# Patient Record
Sex: Male | Born: 1990 | Race: White | Hispanic: No
Health system: Southern US, Community
[De-identification: ages and names within clinical notes are randomized; demographics above are authoritative.]

## PROBLEM LIST (undated history)

## (undated) DIAGNOSIS — F329 Major depressive disorder, single episode, unspecified: Secondary | ICD-10-CM

## (undated) DIAGNOSIS — F419 Anxiety disorder, unspecified: Secondary | ICD-10-CM

## (undated) DIAGNOSIS — F32A Depression, unspecified: Secondary | ICD-10-CM

## (undated) HISTORY — DX: Depression, unspecified: F32.A

## (undated) HISTORY — DX: Major depressive disorder, single episode, unspecified: F32.9

## (undated) HISTORY — DX: Anxiety disorder, unspecified: F41.9

## (undated) HISTORY — PX: OTHER SURGICAL HISTORY: SHX169

---
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
## 2012-01-02 ENCOUNTER — Other Ambulatory Visit: Payer: Self-pay | Admitting: Family Medicine

## 2012-01-02 DIAGNOSIS — Z0000 Encounter for general adult medical examination without abnormal findings: Secondary | ICD-10-CM

## 2012-01-11 ENCOUNTER — Ambulatory Visit
Admission: RE | Admit: 2012-01-11 | Discharge: 2012-01-11 | Disposition: A | Discharge status: Home / Self Care | Payer: 59 | Source: Ambulatory Visit | Attending: Family Medicine | Admitting: Family Medicine

## 2012-01-11 DIAGNOSIS — Z0000 Encounter for general adult medical examination without abnormal findings: Secondary | ICD-10-CM

## 2012-08-20 ENCOUNTER — Telehealth (HOSPITAL_COMMUNITY): Payer: Self-pay | Admitting: Marriage and Family Therapist

## 2012-08-20 ENCOUNTER — Ambulatory Visit (INDEPENDENT_AMBULATORY_CARE_PROVIDER_SITE_OTHER): Payer: 59 | Admitting: Marriage and Family Therapist

## 2012-08-20 DIAGNOSIS — F321 Major depressive disorder, single episode, moderate: Secondary | ICD-10-CM

## 2012-08-20 DIAGNOSIS — F411 Generalized anxiety disorder: Secondary | ICD-10-CM

## 2012-08-20 DIAGNOSIS — F509 Eating disorder, unspecified: Secondary | ICD-10-CM

## 2012-08-20 DIAGNOSIS — F331 Major depressive disorder, recurrent, moderate: Secondary | ICD-10-CM

## 2012-08-20 NOTE — Progress Notes (Signed)
PSYCHOSOCIAL ASSESSMENT  Patient ID: [name], male   DOB: 01-16-1991, 22 y.o.   MRN: [id number] Patient:   [name]   DOB:   [birth year]  MR Number:  [id number]  Location:  [facility] OUTPAT[facility]ERAP[facility] [place] [id number] [place] [address] Dept: [phone]           Date of Service:   08/19/12     Start Time:   9:00 a.m. End Time:   10:00 a.m.  Provider/Observer:  Cleophas Dunker LMFT       Billing Code/Service: [phone]  Chief Complaint:    No chief complaint on file.   Reason for Service:   Patient reports depression, anxiety, and intense feelings of guilt around parents and others.  He reports having these feelings for at least four years.  He reports having nightmares, problems with food and body image, loss of interest in activities, suicidal thoughts as well.  Patient reports having trouble in school with focusing, that he gets lost in his thoughts.  He has also had difficulty connecting with others at his previous school due to uncommon interests and his girlfriend cheated on him, all causing patient to do poorly academically and feeling isolated from others while at school.    Current Status:   Patient completed the Burns Anxiety Inventory with a score of 55, extreme anxiety, and the Burns Depression Checklist with a score of 29, moderate depression.  He did score high in suicidal impulses on the Burns.  Currently patient has changed some of his situation by attending a school closer to home where he has support in friends.  He is currently on academic suspension due to grades but will be returning in the fall.  He reports having a good support system.  Currently he may have to move home due to finances and has a lot of anxiety around financial problems and problems with his parents.    Reliability of Information:  Patient is a good historian and appeared open and truthful during the  assessment.  Behavioral Observation: [name]  presents as a 22 y.o.-year-old Caucasian Male who appeared his stated age. his dress was Appropriate and he was Casual and his manners were Appropriate to the situation.  There were not any physical disabilities noted.  he displayed an appropriate level of cooperation and motivation.    Interactions:    Active   Attention:   normal  Memory:   within normal limits  Visuo-spatial:   not examined  Speech (Volume):  normal  Speech:   normal pitch and normal volume  Thought Process:  Relevant  Though Content:  WNL  Orientation:   person, place and time/date  Judgment:   Fair  Planning:   Fair  Affect:    Depressed  Mood:    Depressed  Insight:   Fair  Intelligence:   normal  Marital Status/Living: Patient is single with no children.  Patient states he is in a "constant battle with his parents."  He is an only child.  Patient is on his own at this time but reports he may have to move back in with his parents.  He reports the relationship is strained, and describes his parents as "helicopter parents."  He is worried because if he moves home he will have no privacy since his parents' bedroom is next to his.  He reports his relationship with his parents as being significant regarding why he is depressed  and anxious.  Patient states he has frequent nightmares about "mom's expectations."  He reports feeling guilty when he gets angry or thinks negatively about them.  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  In the future patient wants to work in skateboarding media possibly having to do with skateboarding magazines.  Past Employment:  Patient reports no past employment.  Substance Use:  No substance abuse noted.  Patient states that because he does not use any drugs and/or alcohol he has felt  isolated at college since most other student do use.  His feelings of isolation caused him some depression and poor academic performance which lead to his leaving [employer].  Patient does admit to some use in the past with drinking alcohol four years ago and marijuana using rarely with last use in 2011.  Education:   HS Graduate.  Patient has some college.  He was a film studies major at [employer] but left due to feelings of isolation (see below) and the breakup with his girlfriend from high school who he said cheated on him.  He said upon leaving his grades were poor due to a depression.  He reports coming back home and going to [place] and majored in media studies in the Fall of 2013 where he had a stronger support system that he said is important to him.  He reports although he had all A's and one B, the B put him on academic suspension due to [employer]'s grades.  He is not allowed to go back to school until the Fall.    Medical History:  No past medical history on file.      No outpatient encounter prescriptions on file as of 08/20/2012.   No facility-administered encounter medications on file as of 08/20/2012.        Patient reports he is currently on no medications for depression or anxiety.  Patient only reports arm fractures that happened as a result of skateboarding.  Sexual History:   History  Sexual Activity  . Sexually Active: Not on file    Abuse/Trauma History:  Denies experiencing any form of abuse.  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 He was treated in an intensive outpatient program in [place] where he stayed in a [employer] house.  His parents participated in the treatment.  Thereafter, he saw a therapist for individual weekly therapy for about a year.  He does not remember the therapist's name.  He also states that his parents and patient had a family session with another therapist that was so upsetting for him that he stopped seeing that  therapist.  Family Med/Psych History: Patient reports his paternal grandfather had severe depression.  Risk of Suicide/Violence: moderate re suicide ideation.  Patient reports having multiple suicidal thoughts over the past years, spending time contemplating suicide. He reports no history of attempts. He reported no real plan although he says he "longs for the convenience of death" at times.  Patient reports his interactions with his parents are his biggest trigger.  Patient states when he is depressed he thinks, "I can just kill myself."  He reports when this happens he "gets a warm feeling about killing himself."  However, patient states this feeling also leads to him feeling scared.  He says he fears he will mess up the suicide  attempt.  His last thought was four days ago over an argument he had with his parents.  He says he felt humiliated because the argument was in church in front of others over a missing TV remote.  Patient denies any homicidal ideation.  Impression/DX: Patient is a 22 year-old male with depression and anxiety.   He has trouble focusing in school.  At this time it is difficult to say if patient has ADHD due to the level of depression and anxiety in particular.  Will not rule it out as a possibilty.  He also shows poor self-esteem particularly in areas of decision-making and body image.  Patient has difficulty with body image and admits to eating emotionally.  Although he has been treated for this condition it has improved but still remains a problem for him.  Patient has a difficult relationship with his parents, particularly his mother, although he loves them and does not blame them for why he has difficulty in these relationships. Another stressor is his financial situation and does not want to move back in with his parents.  He admits this is his biggest trigger for depression.  He does not want parents involved with his therapy at this time. Patient's strengths:  Patient wants to  get better.  He is fairly insightful for a 22 year-old.  He also has a good support system through friends.  He has passions, specifically playing the guitar (for five years), plays in a band, skateboarding (for 11 years), and exercises.  Goal of therapy:  "I'd like to figure out how to better control where my problems are stemming from and feel a bit more confident with decisions I'm making with my life."     Disposition/Plan: Patient will attend individual therapy weekly.  He will be assessed for suicide on an ongoing basis.  This will include using CBT around identifying faulty thinking related to suicide.  Will teach and encourage patient ways to individuate in healthy ways from his parents as a young adult through assessment training.  This will also help him learn to trust his decision-making and increase his self-esteem.  Will use strength-based therapy to help patient identify his strengths and successes.  He will learn CBT to decrease his depression and anxiety.  Will learn biofeedback, breathing, and medication practices to help patient focus and to calm his anxiety.  Explore the option of medication as part of his treatment.  He was given Suicide Information Booklet and told what numbers he can call if needed.  Diagnosis:    Axis I:   Major depressive d/o, moderate       GAD       Eating d/o, NOS      Axis II: Deferred       Axis III:   None - past fractures due to skateboarding      Axis IV:  economic problems, educational problems, housing problems and problems with primary support group          Axis V:  51-60 moderate symptoms  Cleophas Dunker, LMFT, CTS COUNSELOR August 19, 2012

## 2012-08-28 ENCOUNTER — Ambulatory Visit (INDEPENDENT_AMBULATORY_CARE_PROVIDER_SITE_OTHER): Payer: 59 | Admitting: Marriage and Family Therapist

## 2012-08-28 DIAGNOSIS — F331 Major depressive disorder, recurrent, moderate: Secondary | ICD-10-CM

## 2012-08-28 DIAGNOSIS — F509 Eating disorder, unspecified: Secondary | ICD-10-CM

## 2012-08-28 DIAGNOSIS — F410 Panic disorder [episodic paroxysmal anxiety] without agoraphobia: Secondary | ICD-10-CM

## 2012-08-28 NOTE — Progress Notes (Signed)
   THERAPIST PROGRESS NOTE  Session Time:  2:00 - 3:00 p.m.  Participation Level: Active  Behavioral Response: CasualAlertAnxious and Depressed  Type of Therapy: Individual Therapy  Treatment Goals addressed: Coping  Interventions: CBT, Strength-based, Biofeedback, Assertiveness Training, Supportive and Meditation: Mindfulness  Summary: [name] is a 22 y.o. male who presents with depression, anxiety, and an eating disorder.  He was referred by Annabell Sabal, Manager.   Patient reports today he is "not doing too bad."  He talked about his history with an eating disorder that started in high school.  He reports other students made fun of him because he was bigger than other students.  Patient's mother encouraged him to join the track team which he did leading to becoming obsessive with the running.  From that point he started to eat less, at times chewing food and then spitting it out as a habit.  He reported losing 45 lbs.  Patient stated he got into a treatment program after his pediatric physician, who told him she had had an eating disorder, noticed signs of anorexia and put him in the [facility] for evaluation.  From that point he completed an eating program in [place] whose focus was helping patient gain weight.  After completion for a year patient worked with a dietitian and was in therapy, in part using CBT around faulty perceptions related to eating.  He reports still having problems at time wanting to only chew his food, and notices this is around feeling stress.  He did say he no longer has a problem with weight and never weighs himself, knowing that there will be consequences in his recovery if he starts that behavior.  Patient states that part of his therapy is to identify his triggers around eating.  Suicidal/Homicidal: Nowithout intent/plan  Therapist Response:   Reviewed patient's treatment plan.  Patient agreed and signed it and received a copy of same.  Discussed in  depth patient's suicidal thinking related to CBT (specifically around patient finding comfort in suicidal thinking) and that he will be periodically assessed.  Reiterated patient's Suicide Information brochure as a resource and patient agreed to use it if necessary.  Discussed at length patient's score on his Burns Anxiety Checklist and gave patient information related to identifying triggers around food using CBT.  Specifically talked about starting to identify other concerns before identifying triggers such as identifying what sensations patient was feeling in his body (the ones he listed on the Burns).  This is patient's homework for next week.  Also discussed patient's substance abuse history.  He admits to only trying alcohol and marijuana in high school once, decided "this is not for me," and became a "strait edge" (takes no drugs).  Also discussed patient considering psychotropic medications due to level of depression and anxiety.  Patient reports taking Zoloft for a year after treatment and "hated how he felt on it."  He does not want to be on medication at this time and took it off of his treatment plan.  Plan: Return again in 1 weeks.  Diagnosis: Axis I: Major depressive d/o, moderate; Panic d/o; Eating d/o, NOS    Axis II: Deferred    Ratasha Fabre, LMFT, CTS 08/28/2012

## 2012-09-04 ENCOUNTER — Ambulatory Visit (INDEPENDENT_AMBULATORY_CARE_PROVIDER_SITE_OTHER): Payer: 59 | Admitting: Marriage and Family Therapist

## 2012-09-04 DIAGNOSIS — F331 Major depressive disorder, recurrent, moderate: Secondary | ICD-10-CM

## 2012-09-04 DIAGNOSIS — F410 Panic disorder [episodic paroxysmal anxiety] without agoraphobia: Secondary | ICD-10-CM

## 2012-09-04 NOTE — Progress Notes (Signed)
   THERAPIST PROGRESS NOTE  Session Time: 1:00 - 2:00 p.m.  Participation Level: Active  Behavioral Response: CasualAlertAnxious and Depressed  Type of Therapy: Individual Therapy  Treatment Goals addressed: Coping  Interventions: CBT, Strength-based, Biofeedback and Supportive  Summary: [name] is a 22 y.o. male who presents with depression, anxiety, and an eating disorder.  He was referred by Annabell Sabal, Manager.   Patient first reported he went to see a band in [place] that triggered his feeling depressed because he had left [place].  Patient stated he knew why - because he was "second guessing" his decision to leave [employer].  He also said it brought back negative memories being there.  Patient states he did not really do the homework assignment to identify earlier what sensations he was experiencing when triggered by these symptoms/triggers.    Suicidal/Homicidal: Nowithout intent/plan  Therapist Response: Gave patient a copy of his Burns Anxiety Inventory to help him identify feelings and sensations around his anxiety; the purpose is to help him identify same before being triggered behaviorally (with anxiety and depression).  Also began CBT.  Patient did very well because he had experienced CBT while in treatment for his eating disorder.  He states as he was going through the list of cognitive distortions that he had been working on some of them since having eating disorder treatment without being aware he was doing the work.  Patient states he has done a lot of work on "all or nothing thinking" in particular.  He also reports still having difficult with the following:  Overgeneralizing, mental filter, moreso with disqualifying the positive and jumping to conclusions. He admits that when he is in a situation where he feels uncomfortable (such as in a crowd with the opposite sex), he believes "no one would be interested in talking to me."  However, he also states he is used to  being an only child and observing situations before reacting, leading to jumping to conclusions being "buffered" by this skill.  He also uses catastrophizing, emotional reasoning (to some degree), "should" statements, and personalization.  He talked about having difficulty making decisions because he does not trust himself enough to think he is making the "right" decision.  Discussed all decisions having a "trade off."  Homework is to  Begin to identify distortions, rate how he feels, and challenge this thinking.  He will discuss in the next session.  Also educated patient about what anxiety is and what a panic attack is.  Also began educating patient about [employer] biofeedback.  Gave patient HM brochure and taught the first breathing technique:  Neutral breathing (focus is on heart to help patient defocus away from his ruminating).  He will do the exercise 30 seconds to five minutes whenever he thinks about the technique.  Plan: Return again in 1 weeks.  Diagnosis: Axis I: Major depressive d/o, moderate; Panic d/o; Eating d/o NOS    Axis II: Deferred    Katielynn Horan, LMFT, CTS 09/04/2012

## 2012-09-10 ENCOUNTER — Ambulatory Visit (HOSPITAL_COMMUNITY): Payer: Self-pay | Admitting: Marriage and Family Therapist

## 2012-09-18 ENCOUNTER — Ambulatory Visit (HOSPITAL_COMMUNITY): Payer: Self-pay | Admitting: Marriage and Family Therapist

## 2012-09-18 ENCOUNTER — Telehealth (HOSPITAL_COMMUNITY): Payer: Self-pay | Admitting: Marriage and Family Therapist

## 2012-09-18 NOTE — Progress Notes (Unsigned)
   THERAPIST PROGRESS NOTE  Session Time:  10:00 - 11:00 a.m.  Participation Level: {BHH PARTICIPATION LEVEL:22264}  Behavioral Response: {Appearance:22683}{BHH LEVEL OF CONSCIOUSNESS:22305}{BHH MOOD:22306}  Type of Therapy: Individual Therapy  Treatment Goals addressed: Coping  Interventions: {CHL AMB BH Type of Intervention:21022753}  Summary: [name] is a 22 y.o. male who presents with depression, anxiety, and an eating disorder.  He was referred by Annabell Sabal.   Suicidal/Homicidal: {BHH YES OR NO:22294}{yes/no/with/without intent/plan:22693}  Therapist Response: ***  Plan: Return again in 1 weeks.  Diagnosis: Axis I: Major depressive d/o, moderate; Panic d/o; Eating d/o NOS    Axis II: Deferred    Alencia Gordon, LMFT, CTS 09/18/2012

## 2012-09-24 ENCOUNTER — Ambulatory Visit (HOSPITAL_COMMUNITY): Payer: Self-pay | Admitting: Marriage and Family Therapist

## 2012-09-24 NOTE — Progress Notes (Unsigned)
   THERAPIST PROGRESS NOTE  Session Time:  9:00 - 10:00 a.m.  Participation Level: Did Not Attend  Behavioral Response:   Type of Therapy: Individual Therapy  Treatment Goals addressed: Coping  Interventions:   Summary: [name] is a 22 y.o. male who presents with depression, anxiety, and an eating disorder.  He was referred by Annabell Sabal, Director.  PATIENT DID NOT SHOW FOR HIS APOINTMENT.  Suicidal/Homicidal:   Therapist Response:   THIS IS THE SECOND WEEK IN A ROW PATIENT HAS NOT SHOWN FOR HIS APPOINTMENT.  WILL CHARGE FOR SESSION.  Plan: Return again in 1 weeks.  Diagnosis: Axis I: Major depressive d/o, moderate; Panic d/o; Eating d/o, NOS    Axis II: Deferred    Jahmarion Popoff, LMFT, CTS 09/24/2012

## 2012-10-01 ENCOUNTER — Telehealth (HOSPITAL_COMMUNITY): Payer: Self-pay | Admitting: Marriage and Family Therapist

## 2012-10-01 ENCOUNTER — Ambulatory Visit (HOSPITAL_COMMUNITY): Payer: Self-pay | Admitting: Marriage and Family Therapist

## 2012-10-01 NOTE — Telephone Encounter (Signed)
Patient came in worried that he would be discharged due to missing appointments.  Spoke with him to let him know he would not be discharged at this time.  Cleophas Dunker, LMFT, CTS October 01, 2012

## 2012-10-08 ENCOUNTER — Ambulatory Visit (INDEPENDENT_AMBULATORY_CARE_PROVIDER_SITE_OTHER): Payer: 59 | Admitting: Marriage and Family Therapist

## 2012-10-08 DIAGNOSIS — F410 Panic disorder [episodic paroxysmal anxiety] without agoraphobia: Secondary | ICD-10-CM

## 2012-10-08 DIAGNOSIS — F509 Eating disorder, unspecified: Secondary | ICD-10-CM

## 2012-10-08 DIAGNOSIS — F331 Major depressive disorder, recurrent, moderate: Secondary | ICD-10-CM

## 2012-10-08 NOTE — Progress Notes (Signed)
   THERAPIST PROGRESS NOTE  Session Time:  10:00 - 11:00 a.m.  Participation Level: Active  Behavioral Response: CasualAlertAnxious  Type of Therapy: Individual Therapy  Treatment Goals addressed: Coping  Interventions: Strength-based, Biofeedback and Supportive  Summary: [name] is a 22 y.o. male who presents with depression, anxiety, and an eating disorder.  He was referred by Annabell Sabal.  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  He reports he not only forgot these appointments but ended up having trouble with work because he scheduled himself at two jobs at the same time.  He reports he has been feeling "okay," but did say he had a breakdown over being overscheduled, something he periodically experiences.  He admits having a breakdown in front of others scares other people, and described patient getting so upset he ends up repeatedly hitting himself (patient does this to not hit anyone else) which works as a release of feelings.  He says he is worried because he has not found a way to stop this and if he does he does not have another release that would work as well.     Suicidal/Homicidal: Nowithout intent/plan  Therapist Response:   Patient reports he has been using the [employer] biofeedback breathing techniques and said they seemed to be helping with his symptoms.  Patient was introduced to the [employer].  He was educated about what biofeedback is and how it can help with his symptoms.  Discussed finding a more positive baseline to the one he has "depression and anxiety."  Patient was 67% in the red (not calm) during the baseline for 30 seconds.  He was 100% in the red when asked to experience a negative event (arguments with mother).  He was however able to maintain coherence for 91% of the time (being in the green on the screen).  He was also asked to incorporate a  positive feeling into his breathing and kept coherent (calm) for 74% of the time.  His homework is to use the neutral technique first and then do 3-5 minutes of quick coherence (incorporating a positive feeling), in the morning and before bed.  He will also do it throughout the day.  Plan: Return again in 1 weeks.  Diagnosis: Axis I: Major depressive d/o, moderate; Panic d/o, Eating d/o NOS    Axis II: Deferred    Soren Lazarz, LMFT, CTS 10/08/2012

## 2012-10-15 ENCOUNTER — Ambulatory Visit (HOSPITAL_COMMUNITY): Payer: Self-pay | Admitting: Marriage and Family Therapist

## 2012-10-16 ENCOUNTER — Encounter (HOSPITAL_COMMUNITY): Payer: Self-pay

## 2012-10-22 ENCOUNTER — Ambulatory Visit (HOSPITAL_COMMUNITY): Payer: Self-pay | Admitting: Marriage and Family Therapist

## 2012-10-29 ENCOUNTER — Ambulatory Visit (HOSPITAL_COMMUNITY): Payer: Self-pay | Admitting: Marriage and Family Therapist

## 2012-11-05 ENCOUNTER — Ambulatory Visit (HOSPITAL_COMMUNITY): Payer: Self-pay | Admitting: Marriage and Family Therapist

## 2012-11-10 ENCOUNTER — Ambulatory Visit (HOSPITAL_COMMUNITY): Payer: Self-pay | Admitting: Psychiatry

## 2012-11-12 ENCOUNTER — Ambulatory Visit (HOSPITAL_COMMUNITY): Payer: Self-pay | Admitting: Marriage and Family Therapist

## 2012-11-19 ENCOUNTER — Ambulatory Visit (HOSPITAL_COMMUNITY): Payer: Self-pay | Admitting: Marriage and Family Therapist

## 2012-11-26 ENCOUNTER — Ambulatory Visit (HOSPITAL_COMMUNITY): Payer: Self-pay | Admitting: Marriage and Family Therapist

## 2012-12-10 ENCOUNTER — Ambulatory Visit (HOSPITAL_COMMUNITY): Payer: Self-pay | Admitting: Marriage and Family Therapist

## 2013-06-11 ENCOUNTER — Emergency Department (HOSPITAL_COMMUNITY)
Admission: EM | Admit: 2013-06-11 | Discharge: 2013-06-12 | Disposition: A | Discharge status: Home / Self Care | Payer: 59 | Attending: Emergency Medicine | Admitting: Emergency Medicine

## 2013-06-11 ENCOUNTER — Emergency Department (HOSPITAL_COMMUNITY): Payer: 59

## 2013-06-11 ENCOUNTER — Encounter (HOSPITAL_COMMUNITY): Payer: Self-pay | Admitting: Emergency Medicine

## 2013-06-11 DIAGNOSIS — IMO0002 Reserved for concepts with insufficient information to code with codable children: Secondary | ICD-10-CM | POA: Insufficient documentation

## 2013-06-11 DIAGNOSIS — Y929 Unspecified place or not applicable: Secondary | ICD-10-CM | POA: Insufficient documentation

## 2013-06-11 DIAGNOSIS — S62309A Unspecified fracture of unspecified metacarpal bone, initial encounter for closed fracture: Secondary | ICD-10-CM

## 2013-06-11 DIAGNOSIS — W2209XA Striking against other stationary object, initial encounter: Secondary | ICD-10-CM | POA: Insufficient documentation

## 2013-06-11 DIAGNOSIS — Z79899 Other long term (current) drug therapy: Secondary | ICD-10-CM | POA: Insufficient documentation

## 2013-06-11 DIAGNOSIS — Y939 Activity, unspecified: Secondary | ICD-10-CM | POA: Insufficient documentation

## 2013-06-11 DIAGNOSIS — S62319A Displaced fracture of base of unspecified metacarpal bone, initial encounter for closed fracture: Secondary | ICD-10-CM | POA: Insufficient documentation

## 2013-06-11 NOTE — ED Notes (Signed)
Pt states he punched a brick wall with his right hand and is now having pain  Pt has swelling and abrasion noted

## 2013-06-12 MED ORDER — IBUPROFEN 800 MG PO TABS
800.0000 mg | ORAL_TABLET | Freq: Once | ORAL | Status: AC
  Administered 2013-06-12: 800 mg via ORAL
  Filled 2013-06-12: qty 1

## 2013-06-12 MED ORDER — IBUPROFEN 800 MG PO TABS: 800.0000 mg | ORAL_TABLET | Freq: Three times a day (TID) | ORAL | Status: DC

## 2013-06-12 NOTE — Discharge Instructions (Signed)
Take ibuprofen with food, up to 800mg  three times a day, as needed for pain.   Ice 2-3 times a day for 15-20 minutes.  Elevate as often as possible.  Follow up with the hand surgeon you have been referred to.   You may return to the ER if symptoms worsen or you have any other concerns.

## 2013-06-12 NOTE — ED Notes (Signed)
Ortho paged to place splint at this time

## 2013-06-12 NOTE — ED Provider Notes (Signed)
Medical screening examination/treatment/procedure(s) were performed by non-physician practitioner and as supervising physician I was immediately available for consultation/collaboration.   EKG Interpretation None       Derwood KaplanAnkit Ivor Kishi, MD 06/12/13 2221

## 2013-06-12 NOTE — ED Provider Notes (Signed)
CSN: [id number]     Arrival date & time 06/11/13  2339 History   First MD Initiated Contact with Patient 06/12/13 [phone]     Chief Complaint  Patient presents with  . Hand Injury     (Consider location/radiation/quality/duration/timing/severity/associated sxs/prior Treatment) HPI History provided by pt.   Pt punched a brick wall with his right hand this evening.  Has had severe pain medial aspect ever since.  Aggravated by ROM pinky.  Associated w/ edema.  Denies paresthesias.  Has not taken anything for pain. History reviewed. No pertinent past medical history. Past Surgical History  Procedure Laterality Date  . Arm surgery bialterally      Family History  Problem Relation Age of Onset  . Cancer Other    History  Substance Use Topics  . Smoking status: Never Smoker   . Smokeless tobacco: Not on file  . Alcohol Use: No    Review of Systems  All other systems reviewed and are negative.      Allergies  Review of patient's allergies indicates no known allergies.  Home Medications   Current Outpatient Rx  Name  Route  Sig  Dispense  Refill  . fluticasone (FLONASE) 50 MCG/ACT nasal spray   Each Nare   Place 1 spray into both nostrils daily as needed for allergies or rhinitis.         . Multiple Vitamin (MULTIVITAMIN WITH MINERALS) TABS tablet   Oral   Take 1 tablet by mouth daily.         Marland Kitchen ibuprofen (ADVIL,MOTRIN) 800 MG tablet   Oral   Take 1 tablet (800 mg total) by mouth 3 (three) times daily.   12 tablet   0    BP 116/72  Pulse 90  Temp(Src) 98.5 F (36.9 C) (Oral)  Resp 18  Ht 5\' 7"  (1.702 m)  Wt 146 lb (66.225 kg)  BMI 22.86 kg/m2  SpO2 97% Physical Exam  Nursing note and vitals reviewed. Constitutional: He is oriented to person, place, and time. He appears well-developed and well-nourished. No distress.  HENT:  Head: Normocephalic and atraumatic.  Eyes:  Normal appearance  Neck: Normal range of motion.  Pulmonary/Chest: Effort normal.   Musculoskeletal: Normal range of motion.  Edema and tenderness dorsal aspect 4th and 5th metacarpals.  Pain w/ passive flexion/extension 5th finger.  No pain w/ ROM of wrist.  Brisk cap refill and distal sensation intact.  Neurological: He is alert and oriented to person, place, and time.  Psychiatric: He has a normal mood and affect. His behavior is normal.    ED Course  Procedures (including critical care time) Labs Review Labs Reviewed - No data to display Imaging Review Dg Hand Complete Right  06/12/2013   CLINICAL DATA:  And injury. Bruising and swelling fifth metacarpal area.  EXAM: RIGHT HAND - COMPLETE 3+ VIEW  COMPARISON:  None.  FINDINGS: There is a nondisplaced fracture through the base of the fifth metacarpal. There is overlying soft tissue swelling. There is no dislocation. Joint spaces are preserved.  IMPRESSION: Fracture through the base of the fifth metacarpal.   Electronically Signed   By: Sebastian Ache   On: 06/12/2013 01:06     EKG Interpretation None      MDM   Final diagnoses:  Fracture of metacarpal of right hand, closed    23yo healthy M presents w/ boxer's fracture. NV intact.  Ortho tech placed in ulna gutter splint.  Pt received ibuprofen for pain.  Referred  to Hand.  Return precautions discussed. 3:19 AM     Otilio Miuatherine E Henry Demeritt, PA-C 06/12/13 [phone]

## 2013-06-15 ENCOUNTER — Emergency Department (HOSPITAL_COMMUNITY)
Admission: EM | Admit: 2013-06-15 | Discharge: 2013-06-15 | Disposition: A | Discharge status: Home / Self Care | Payer: 59 | Attending: Emergency Medicine | Admitting: Emergency Medicine

## 2013-06-15 ENCOUNTER — Encounter (HOSPITAL_COMMUNITY): Payer: Self-pay | Admitting: Emergency Medicine

## 2013-06-15 DIAGNOSIS — F329 Major depressive disorder, single episode, unspecified: Secondary | ICD-10-CM | POA: Diagnosis present

## 2013-06-15 DIAGNOSIS — F3289 Other specified depressive episodes: Secondary | ICD-10-CM | POA: Insufficient documentation

## 2013-06-15 DIAGNOSIS — Z8781 Personal history of (healed) traumatic fracture: Secondary | ICD-10-CM | POA: Insufficient documentation

## 2013-06-15 DIAGNOSIS — F32A Depression, unspecified: Secondary | ICD-10-CM | POA: Diagnosis present

## 2013-06-15 DIAGNOSIS — R45851 Suicidal ideations: Secondary | ICD-10-CM | POA: Insufficient documentation

## 2013-06-15 DIAGNOSIS — F39 Unspecified mood [affective] disorder: Secondary | ICD-10-CM | POA: Insufficient documentation

## 2013-06-15 DIAGNOSIS — F411 Generalized anxiety disorder: Secondary | ICD-10-CM | POA: Insufficient documentation

## 2013-06-15 DIAGNOSIS — F419 Anxiety disorder, unspecified: Secondary | ICD-10-CM | POA: Diagnosis present

## 2013-06-15 LAB — COMPREHENSIVE METABOLIC PANEL
ALK PHOS: 69 U/L (ref 39–117)
ALT: 16 U/L (ref 0–53)
AST: 19 U/L (ref 0–37)
Albumin: 3.8 g/dL (ref 3.5–5.2)
BUN: 13 mg/dL (ref 6–23)
CO2: 29 mEq/L (ref 19–32)
Calcium: 9.8 mg/dL (ref 8.4–10.5)
Chloride: 103 mEq/L (ref 96–112)
Creatinine, Ser: 0.77 mg/dL (ref 0.50–1.35)
GLUCOSE: 94 mg/dL (ref 70–99)
POTASSIUM: 4.8 meq/L (ref 3.7–5.3)
Sodium: 139 mEq/L (ref 137–147)
Total Bilirubin: 1.2 mg/dL (ref 0.3–1.2)
Total Protein: 6.4 g/dL (ref 6.0–8.3)

## 2013-06-15 LAB — CBC WITH DIFFERENTIAL/PLATELET
Basophils Absolute: 0 10*3/uL (ref 0.0–0.1)
Basophils Relative: 1 % (ref 0–1)
Eosinophils Absolute: 0 10*3/uL (ref 0.0–0.7)
Eosinophils Relative: 1 % (ref 0–5)
HEMATOCRIT: 43 % (ref 39.0–52.0)
HEMOGLOBIN: 15.7 g/dL (ref 13.0–17.0)
LYMPHS ABS: 0.9 10*3/uL (ref 0.7–4.0)
Lymphocytes Relative: 20 % (ref 12–46)
MCH: 32.6 pg (ref 26.0–34.0)
MCHC: 36.5 g/dL — ABNORMAL HIGH (ref 30.0–36.0)
MCV: 89.2 fL (ref 78.0–100.0)
MONOS PCT: 7 % (ref 3–12)
Monocytes Absolute: 0.3 10*3/uL (ref 0.1–1.0)
NEUTROS ABS: 3.4 10*3/uL (ref 1.7–7.7)
NEUTROS PCT: 72 % (ref 43–77)
Platelets: 213 10*3/uL (ref 150–400)
RBC: 4.82 MIL/uL (ref 4.22–5.81)
RDW: 11.2 % — ABNORMAL LOW (ref 11.5–15.5)
WBC: 4.7 10*3/uL (ref 4.0–10.5)

## 2013-06-15 LAB — RAPID URINE DRUG SCREEN, HOSP PERFORMED
Amphetamines: NOT DETECTED
Barbiturates: NOT DETECTED
Benzodiazepines: NOT DETECTED
Cocaine: NOT DETECTED
Opiates: NOT DETECTED
Tetrahydrocannabinol: NOT DETECTED

## 2013-06-15 LAB — ETHANOL: Alcohol, Ethyl (B): <11 mg/dL (ref 0–11)

## 2013-06-15 MED ORDER — ACETAMINOPHEN 325 MG PO TABS: 650.0000 mg | ORAL_TABLET | Freq: Four times a day (QID) | ORAL | Status: DC | PRN

## 2013-06-15 MED ORDER — HYDROXYZINE PAMOATE 25 MG PO CAPS: 25.0000 mg | ORAL_CAPSULE | Freq: Four times a day (QID) | ORAL | Status: DC | PRN

## 2013-06-15 MED ORDER — IBUPROFEN 200 MG PO TABS: 600.0000 mg | ORAL_TABLET | Freq: Three times a day (TID) | ORAL | Status: DC | PRN

## 2013-06-15 MED ORDER — HYDROXYZINE PAMOATE 100 MG PO CAPS: 100.0000 mg | ORAL_CAPSULE | Freq: Four times a day (QID) | ORAL | Status: DC | PRN

## 2013-06-15 NOTE — ED Notes (Signed)
Bed: WA31 Expected date:  Expected time:  Means of arrival:  Comments: TCU  

## 2013-06-15 NOTE — ED Notes (Signed)
Patient's visitor took 1 bag of belongings including black iPhone out to car

## 2013-06-15 NOTE — Consult Note (Signed)
[facility] Face-to-Face Psychiatry Consult   Reason for Consult:  Depression Referring Physician:  ED MD [name] is an 23 y.o. male. Total Time spent with patient: 30 minutes  Assessment: AXIS I:  Anxiety Disorder NOS and Depressive Disorder NOS AXIS II:  Deferred AXIS III:  History reviewed. No pertinent past medical history. AXIS IV:  other psychosocial or environmental problems and problems related to social environment AXIS V:  61-70 mild symptoms  Plan:  No evidence of imminent risk to self or others at present.  He will go to Intensive Outpatient at [facility] tomorrow.  Dr. De Nurse consulted and concurs with the plan.  Subjective:   [name] is a 23 y.o. male patient does not warrant admission.  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  His grades at school [employer]) are poor also.  This past weekend he had thoughts of hanging himself in his closet.  He has a past history of anorexia and was treated in an intensive outpatient program with success, normal weight at this time.  Today, he went to the counseling center at [facility] and they called his dad about his depression who brought him to the ED.  [name] denies suicidal/homicidal ideations or hallucinations at this time and is glad he did not hurt himself this weekend.  His dad is at the bedside and is supportive of his son.  Neither feels inpatient is an appropriate choice and would increase his anxiety.  He would like to go to intensive outpatient at [facility] and move home.  His dad is in agreement and stated he is always welcome home with him and his wife.  [name] is in agreement.  Presents in a calm demeanor despite saying he was anxious.  When this assessor stated he seemed relieved and calm, he said he was glad his parents knew and he did currently feel calm at this time.  He had been sleeping when I came into the room.   HPI Elements:    Location:  generalized. Quality:  mild. Severity:  mild. Timing:  one week. Duration:  intermittent. Context:  stressors, academic and social.  Past Psychiatric History: History reviewed. No pertinent past medical history.  reports that he has never smoked. He does not have any smokeless tobacco history on file. He reports that he does not drink alcohol or use illicit drugs. Family History  Problem Relation Age of Onset  . Cancer Other            Allergies:  No Known Allergies  ACT Assessment Complete:  No:   Past Psychiatric History: Diagnosis:  Anorexia --treated with IOP,healthy weight at this time been doing  well  Hospitalizations:  None  Outpatient Care:  [facility] counseling  Substance Abuse Care:  NA  Self-Mutilation:  NA  Suicidal Attempts:  Hanging thoughts over the week-end, resolved  Homicidal Behaviors:  NOne   Violent Behaviors:  None   Place of Residence:  [facility] apartment Marital Status:  Single Employed/Unemployed:  Ship broker, plays in a band Education:  Secretary/administrator Family Supports:  Supportive parents, friends Objective: Blood pressure 109/59, pulse 61, temperature 98.4 F (36.9 C), temperature source Oral, resp. rate 20, SpO2 98.00%.There is no weight on file to calculate BMI. Results for orders placed during the hospital encounter of 06/15/13 (from the past 72 hour(s))  CBC WITH DIFFERENTIAL     Status: Abnormal  Collection Time    06/15/13  1:50 PM      Result Value Ref Range   WBC 4.7  4.0 - 10.5 K/uL   RBC 4.82  4.22 - 5.81 MIL/uL   Hemoglobin 15.7  13.0 - 17.0 g/dL   HCT 43.0  39.0 - 52.0 %   MCV 89.2  78.0 - 100.0 fL   MCH 32.6  26.0 - 34.0 pg   MCHC 36.5 (*) 30.0 - 36.0 g/dL   RDW 11.2 (*) 11.5 - 15.5 %   Platelets 213  150 - 400 K/uL   Neutrophils Relative % 72  43 - 77 %   Neutro Abs 3.4  1.7 - 7.7 K/uL   Lymphocytes Relative 20  12 - 46 %   Lymphs Abs 0.9  0.7 - 4.0 K/uL   Monocytes Relative 7  3 - 12 %   Monocytes Absolute 0.3  0.1 - 1.0 K/uL    Eosinophils Relative 1  0 - 5 %   Eosinophils Absolute 0.0  0.0 - 0.7 K/uL   Basophils Relative 1  0 - 1 %   Basophils Absolute 0.0  0.0 - 0.1 K/uL  COMPREHENSIVE METABOLIC PANEL     Status: None   Collection Time    06/15/13  1:50 PM      Result Value Ref Range   Sodium 139  137 - 147 mEq/L   Potassium 4.8  3.7 - 5.3 mEq/L   Chloride 103  96 - 112 mEq/L   CO2 29  19 - 32 mEq/L   Glucose, Bld 94  70 - 99 mg/dL   BUN 13  6 - 23 mg/dL   Creatinine, Ser 0.77  0.50 - 1.35 mg/dL   Calcium 9.8  8.4 - 10.5 mg/dL   Total Protein 6.4  6.0 - 8.3 g/dL   Albumin 3.8  3.5 - 5.2 g/dL   AST 19  0 - 37 U/L   ALT 16  0 - 53 U/L   Alkaline Phosphatase 69  39 - 117 U/L   Total Bilirubin 1.2  0.3 - 1.2 mg/dL   GFR calc non Af Amer >90  >90 mL/min   GFR calc Af Amer >90  >90 mL/min   Comment: (NOTE)     The eGFR has been calculated using the CKD EPI equation.     This calculation has not been validated in all clinical situations.     eGFR's persistently <90 mL/min signify possible Chronic Kidney     Disease.  ETHANOL     Status: None   Collection Time    06/15/13  1:50 PM      Result Value Ref Range   Alcohol, Ethyl (B) <11  0 - 11 mg/dL   Comment:            LOWEST DETECTABLE LIMIT FOR     SERUM ALCOHOL IS 11 mg/dL     FOR MEDICAL PURPOSES ONLY  URINE RAPID DRUG SCREEN (HOSP PERFORMED)     Status: None   Collection Time    06/15/13  2:06 PM      Result Value Ref Range   Opiates NONE DETECTED  NONE DETECTED   Cocaine NONE DETECTED  NONE DETECTED   Benzodiazepines NONE DETECTED  NONE DETECTED   Amphetamines NONE DETECTED  NONE DETECTED   Tetrahydrocannabinol NONE DETECTED  NONE DETECTED   Barbiturates NONE DETECTED  NONE DETECTED   Comment:  DRUG SCREEN FOR MEDICAL PURPOSES     ONLY.  IF CONFIRMATION IS NEEDED     FOR ANY PURPOSE, NOTIFY LAB     WITHIN 5 DAYS.                LOWEST DETECTABLE LIMITS     FOR URINE DRUG SCREEN     Drug Class       Cutoff (ng/mL)      Amphetamine      1000     Barbiturate      200     Benzodiazepine   725     Tricyclics       366     Opiates          300     Cocaine          300     THC              50   Labs are reviewed and are pertinent for no medical issues noted.  Current Facility-Administered Medications  Medication Dose Route Frequency Provider Last Rate Last Dose  . acetaminophen (TYLENOL) tablet 650 mg  650 mg Oral Q6H PRN Mirna Mires, MD      . ibuprofen (ADVIL,MOTRIN) tablet 600 mg  600 mg Oral TID PRN Mirna Mires, MD       Current Outpatient Prescriptions  Medication Sig Dispense Refill  . fluticasone (FLONASE) 50 MCG/ACT nasal spray Place 1 spray into both nostrils daily as needed for allergies or rhinitis.      Marland Kitchen ibuprofen (ADVIL,MOTRIN) 800 MG tablet Take 800 mg by mouth every 6 (six) hours as needed for moderate pain.      . Multiple Vitamin (MULTIVITAMIN WITH MINERALS) TABS tablet Take 1 tablet by mouth daily.        Psychiatric Specialty Exam:     Blood pressure 109/59, pulse 61, temperature 98.4 F (36.9 C), temperature source Oral, resp. rate 20, SpO2 98.00%.There is no weight on file to calculate BMI.  General Appearance: Casual  Eye Contact::  Good  Speech:  Normal Rate  Volume:  Normal  Mood:  Depressed  Affect:  Congruent  Thought Process:  Coherent  Orientation:  Full (Time, Place, and Person)  Thought Content:  WDL  Suicidal Thoughts:  No  Homicidal Thoughts:  No  Memory:  Immediate;   Good Recent;   Good Remote;   Good  Judgement:  Good  Insight:  Good  Psychomotor Activity:  Normal  Concentration:  Good  Recall:  Good  Fund of Knowledge:Good  Language: Good  Akathisia:  No  Handed:  Right  AIMS (if indicated):     Assets:  Communication Skills Desire for Improvement Financial Resources/Insurance Housing Leisure Time [place] Talents/Skills Vocational/Educational  Sleep:      Musculoskeletal: Strength & Muscle Tone:  within normal limits Gait & Station: normal Patient leans: N/A  Treatment Plan Summary: Discharge to parents who are in agreement for him to go to IOP at [facility] and move back home with them.  Dad at his bedside and supportive, Vistaril 25 mg every six hours PRN anxiety.  [name], Enoch 06/15/2013 5:29 PM Case reviewed. Agree with plan.

## 2013-06-15 NOTE — ED Provider Notes (Signed)
CSN: [id number]     Arrival date & time 06/15/13  1258 History   First MD Initiated Contact with Patient 06/15/13 1407     Chief Complaint  Patient presents with  . Medical Clearance     (Consider location/radiation/quality/duration/timing/severity/associated sxs/prior Treatment) The history is provided by the patient.  pt states jx 'mental health issues', prior eating disorder, anxiety, states has had recent life stressors (which he does not want to expound upon), and that has caused him to feel anxious and depressed. States this past weekend, thoughts of suicide, stating he placed belt around neck, but could not go through with it.  Denies any prior attempt at self harm or suicide. Denies alcohol or drug abuse. Is a Archivist.  States fractures 5th metacarpal last week when hit a wall in frustration/anger.   History reviewed. No pertinent past medical history. Past Surgical History  Procedure Laterality Date  . Arm surgery bialterally      Family History  Problem Relation Age of Onset  . Cancer Other    History  Substance Use Topics  . Smoking status: Never Smoker   . Smokeless tobacco: Not on file  . Alcohol Use: No    Review of Systems  Constitutional: Negative for fever.  HENT: Negative for sore throat.   Eyes: Negative for redness.  Respiratory: Negative for cough and shortness of breath.   Cardiovascular: Negative for chest pain and leg swelling.  Gastrointestinal: Negative for vomiting and abdominal pain.  Genitourinary: Negative for flank pain.  Musculoskeletal: Negative for back pain and neck pain.  Skin: Negative for rash.  Neurological: Negative for headaches.  Hematological: Does not bruise/bleed easily.  Psychiatric/Behavioral: Positive for suicidal ideas and dysphoric mood.      Allergies  Review of patient's allergies indicates no known allergies.  Home Medications   Current Outpatient Rx  Name  Route  Sig  Dispense  Refill  . fluticasone  (FLONASE) 50 MCG/ACT nasal spray   Each Nare   Place 1 spray into both nostrils daily as needed for allergies or rhinitis.         Marland Kitchen ibuprofen (ADVIL,MOTRIN) 800 MG tablet   Oral   Take 800 mg by mouth every 6 (six) hours as needed for moderate pain.         . Multiple Vitamin (MULTIVITAMIN WITH MINERALS) TABS tablet   Oral   Take 1 tablet by mouth daily.          BP 109/59  Pulse 61  Temp(Src) 98.4 F (36.9 C) (Oral)  Resp 20  SpO2 98% Physical Exam  Nursing note and vitals reviewed. Constitutional: He is oriented to person, place, and time. He appears well-developed and well-nourished. No distress.  HENT:  Head: Atraumatic.  Eyes: Conjunctivae are normal. No scleral icterus.  Neck: Neck supple. No tracheal deviation present.  Cardiovascular: Normal rate, normal heart sounds and intact distal pulses.   Pulmonary/Chest: Effort normal and breath sounds normal. No accessory muscle usage. No respiratory distress.  Abdominal: He exhibits no distension. There is no tenderness.  Musculoskeletal: Normal range of motion. He exhibits no edema.  Right hand in ulnar gutter splint. Normal cap refill distally.   Neurological: He is alert and oriented to person, place, and time.  Steady gait.   Skin: Skin is warm and dry.  Psychiatric:  Depressed mood, + SI.     ED Course  Procedures (including critical care time)  Results for orders placed during the hospital encounter of 06/15/13  CBC WITH DIFFERENTIAL      Result Value Ref Range   WBC 4.7  4.0 - 10.5 K/uL   RBC 4.82  4.22 - 5.81 MIL/uL   Hemoglobin 15.7  13.0 - 17.0 g/dL   HCT 16.143.0  09.639.0 - 04.552.0 %   MCV 89.2  78.0 - 100.0 fL   MCH 32.6  26.0 - 34.0 pg   MCHC 36.5 (*) 30.0 - 36.0 g/dL   RDW 40.911.2 (*) 81.111.5 - 91.415.5 %   Platelets 213  150 - 400 K/uL   Neutrophils Relative % 72  43 - 77 %   Neutro Abs 3.4  1.7 - 7.7 K/uL   Lymphocytes Relative 20  12 - 46 %   Lymphs Abs 0.9  0.7 - 4.0 K/uL   Monocytes Relative 7  3 - 12 %    Monocytes Absolute 0.3  0.1 - 1.0 K/uL   Eosinophils Relative 1  0 - 5 %   Eosinophils Absolute 0.0  0.0 - 0.7 K/uL   Basophils Relative 1  0 - 1 %   Basophils Absolute 0.0  0.0 - 0.1 K/uL  COMPREHENSIVE METABOLIC PANEL      Result Value Ref Range   Sodium 139  137 - 147 mEq/L   Potassium 4.8  3.7 - 5.3 mEq/L   Chloride 103  96 - 112 mEq/L   CO2 29  19 - 32 mEq/L   Glucose, Bld 94  70 - 99 mg/dL   BUN 13  6 - 23 mg/dL   Creatinine, Ser 7.820.77  0.50 - 1.35 mg/dL   Calcium 9.8  8.4 - 95.610.5 mg/dL   Total Protein 6.4  6.0 - 8.3 g/dL   Albumin 3.8  3.5 - 5.2 g/dL   AST 19  0 - 37 U/L   ALT 16  0 - 53 U/L   Alkaline Phosphatase 69  39 - 117 U/L   Total Bilirubin 1.2  0.3 - 1.2 mg/dL   GFR calc non Af Amer >90  >90 mL/min   GFR calc Af Amer >90  >90 mL/min  URINE RAPID DRUG SCREEN (HOSP PERFORMED)      Result Value Ref Range   Opiates NONE DETECTED  NONE DETECTED   Cocaine NONE DETECTED  NONE DETECTED   Benzodiazepines NONE DETECTED  NONE DETECTED   Amphetamines NONE DETECTED  NONE DETECTED   Tetrahydrocannabinol NONE DETECTED  NONE DETECTED   Barbiturates NONE DETECTED  NONE DETECTED  ETHANOL      Result Value Ref Range   Alcohol, Ethyl (B) <11  0 - 11 mg/dL   Dg Hand Complete Right  06/12/2013   CLINICAL DATA:  And injury. Bruising and swelling fifth metacarpal area.  EXAM: RIGHT HAND - COMPLETE 3+ VIEW  COMPARISON:  None.  FINDINGS: There is a nondisplaced fracture through the base of the fifth metacarpal. There is overlying soft tissue swelling. There is no dislocation. Joint spaces are preserved.  IMPRESSION: Fracture through the base of the fifth metacarpal.   Electronically Signed   By: Sebastian AcheAllen  Grady   On: 06/12/2013 01:06      MDM  Labs.  Psych team consulted.  Reviewed nursing notes and prior charts for additional history.   Psych team eval pending - disposition per psych team.     Suzi RootsKevin E Reeve Turnley, MD 06/15/13 [phone]

## 2013-06-15 NOTE — [facility] Suicide Risk Assessment (Signed)
Suicide Risk Assessment  Discharge Assessment     Demographic Factors:  Male, Adolescent or young adult and Caucasian  Total Time spent with patient: 30 minutes  Psychiatric Specialty Exam:     Blood pressure 109/59, pulse 61, temperature 98.4 F (36.9 C), temperature source Oral, resp. rate 20, SpO2 98.00%.There is no weight on file to calculate BMI.  General Appearance: Casual  Eye Contact::  Good  Speech:  Normal Rate  Volume:  Normal  Mood:  Depressed  Affect:  Congruent  Thought Process:  Coherent  Orientation:  Full (Time, Place, and Person)  Thought Content:  WDL  Suicidal Thoughts:  No  Homicidal Thoughts:  No  Memory:  Immediate;   Good Recent;   Good Remote;   Good  Judgement:  Good  Insight:  Good  Psychomotor Activity:  Normal  Concentration:  Good  Recall:  Good  Fund of Knowledge:Good  Language: Good  Akathisia:  No  Handed:  Right  AIMS (if indicated):     Assets:  Communication Skills Desire for Improvement Financial Resources/Insurance Housing Leisure Time Physical Health Resilience Social Support Talents/Skills Transportation Vocational/Educational  Sleep:       Musculoskeletal: Strength & Muscle Tone: within normal limits Gait & Station: normal Patient leans: N/A   Mental Status Per Nursing Assessment::   On Admission:     Current Mental Status by Physician: NA  Loss Factors: NA  Historical Factors: NA  Risk Reduction Factors:   Sense of responsibility to family, Living with another person, especially a relative, Positive social support and Positive coping skills or problem solving skills  Continued Clinical Symptoms:  Depression, anxiety  Cognitive Features That Contribute To Risk:  None  Suicide Risk:  Minimal: No identifiable suicidal ideation.  Patients presenting with no risk factors but with morbid ruminations; may be classified as minimal risk based on the severity of the depressive symptoms  Discharge  Diagnoses:   AXIS I:  Anxiety Disorder NOS and Depressive Disorder NOS AXIS II:  Deferred AXIS III:  History reviewed. No pertinent past medical history. AXIS IV:  other psychosocial or environmental problems and problems related to social environment AXIS V:  61-70 mild symptoms  Plan Of Care/Follow-up recommendations:  Activity:  as tolerated Diet:  low-sodium heart healthy diet  Is patient on multiple antipsychotic therapies at discharge:  No   Has Patient had three or more failed trials of antipsychotic monotherapy by history:  No  Recommended Plan for Multiple Antipsychotic Therapies: NA    Khaden Gater, PMH-NP 06/15/2013, 6:10 PM

## 2013-06-15 NOTE — Progress Notes (Signed)
   CARE MANAGEMENT ED NOTE 06/15/2013  Patient:  [name],[name]   Account Number:  [id number]  Date Initiated:  06/15/2013  Documentation initiated by:  Edd ArbourGIBBS,[place] Sites  Subjective/Objective Assessment:   23 yr old [employer] pt without pcp listed Pt confirms pcp is Dr Benetta Sparvictoria Rankins     Subjective/Objective Assessment Detail:     Action/Plan:   EPIC updated   Action/Plan Detail:   Anticipated DC Date:  06/15/2013     Status Recommendation to Physician:   Result of Recommendation:    Other ED Services  Consult Working Plan    DC Planning Services  Other  Outpatient Services - Pt will follow up  PCP issues    Choice offered to / List presented to:            Status of service:  Completed, signed off  ED Comments:   ED Comments Detail:

## 2013-06-15 NOTE — ED Notes (Signed)
Pt states having mental health issues over the weekend; states having suicidal thoughts over the weekend and "came close" to hurting self; states tied his belt to a pole in his room and put it around his neck; no previous history of same; no drug or alcohol use

## 2013-06-17 ENCOUNTER — Other Ambulatory Visit (HOSPITAL_COMMUNITY): Payer: 59 | Attending: Psychiatry | Admitting: Psychiatry

## 2013-06-17 ENCOUNTER — Encounter (HOSPITAL_COMMUNITY): Payer: Self-pay

## 2013-06-17 DIAGNOSIS — F909 Attention-deficit hyperactivity disorder, unspecified type: Secondary | ICD-10-CM | POA: Insufficient documentation

## 2013-06-17 DIAGNOSIS — F3289 Other specified depressive episodes: Secondary | ICD-10-CM

## 2013-06-17 DIAGNOSIS — G4700 Insomnia, unspecified: Secondary | ICD-10-CM | POA: Insufficient documentation

## 2013-06-17 DIAGNOSIS — F329 Major depressive disorder, single episode, unspecified: Secondary | ICD-10-CM

## 2013-06-17 DIAGNOSIS — F411 Generalized anxiety disorder: Secondary | ICD-10-CM | POA: Insufficient documentation

## 2013-06-17 DIAGNOSIS — F410 Panic disorder [episodic paroxysmal anxiety] without agoraphobia: Secondary | ICD-10-CM | POA: Insufficient documentation

## 2013-06-17 DIAGNOSIS — F332 Major depressive disorder, recurrent severe without psychotic features: Secondary | ICD-10-CM | POA: Insufficient documentation

## 2013-06-17 MED ORDER — ESCITALOPRAM OXALATE 20 MG PO TABS: 20.0000 mg | ORAL_TABLET | Freq: Every day | ORAL | Status: DC

## 2013-06-17 NOTE — Progress Notes (Signed)
Patient ID: Corey CourtBenjamin D Simpson, male   DOB: 01/24/1991, 23 y.o.   MRN: [id number] D:  This is a 23 yr old, single, Caucasian male, who was referred [facility], treatment for ongoing depressive and anxiety symptoms with anger outbursts and SI (no plan or intent).  Pt admits to a recent attempt (06-12-13).  States he tried to hang himself with a belt, but couldn't get it right.  "I stopped trying after six hours."  Pt admits to punching himself in the head whenever he gets angry.  Two previous psychiatric hospitalizations.  States in August 2010, [facility] before [facility].  He has been seeing a therapist and [facility] since January 2015.  States that his symptoms include, poor sleep (5  Hrs), irritability, tearfulness, panic attacks (three to four times a month), and poor concentration.  He has been having anger outbursts.  On 06-11-13, pt put his right hand through a wall and broke his it.  Reports that his symptoms worsened a year ago.  Triggers/Stressors:  1)  Fall 2014 pt was placed on academic probation due to failing grades.  2)  Conflict with band members.  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 Family Hx:  Paternal Grandfather and Father were hospitalized for depression and anxiety. Childhood:  Pt was born and raised in [place], [place].  "Good Childhood."  Denies any trauma or abuse.  Pt states that he was obese as a child.  Started running cross country in school.  Had problems with anorexia in high school.  Was hospitalized the summer before his senior yr for it.. Pt is an only child.  Reports that his parents are his support system.  Resides with two roommates.  Employed by [employer] as a Conservation officer, naturecashier.  Has been parttime since January 2015.   Denies any drugs/ETOH.  Never smoked cigarettes. Denies any legal issues. Pt completed all forms.   Scored 29 on the burns.  Pt will attend MH-IOP for ten days.  A:  Oriented pt.  Provided pt with an orientation folder.  Will inform his therapist and psychiatrist of admit.  Encouraged support groups.  R:  Pt receptive.

## 2013-06-17 NOTE — Progress Notes (Signed)
Psychiatric Assessment Adult  Patient Identification:  [name] Date of Evaluation:  06/17/2013 Chief Complaint:  depression and anxiety  History of Chief Complaint:  Patient is a 23 year old white single male who was referred to [place] from [facility] clinic. Last week the patient became severely depressed and had thoughts of suicide so went to the site ED at [facility], and assessment was done and he was given Vistaril 25 mg at bedtime for his anxiety. Patient is a Holiday representative at [employer]. and was placed on academic probation and has now restarted school. He is struggling with his glasses due to disorganization and poor concentration. Patient works part-time at [employer] and last week became very depressed with thoughts of suicide. Reports that his symptoms worsened a year ago around the time when he was placed on academic probation. Patient has anger problems tends to get frustrated easily he is impulsive. He got angry last week and hit a wall and has a boxer's fracture on his right hand. Reports that his sleep is poor with initial insomnia, feels tired appetite is good is very irritable depressed tearful and anxious. Has about 3-4 panic attacks in a month. Feels hopeless and helpless denies homicidal ideation has no hallucinations or delusions.  Patient reports that as a child he was always hyperactive with poor concentration and uses the distracted. He is always been disorganized restless and fidgety. States that he was an obese child and in high school began losing weight and started running and was diagnosed with anorexia and received treatment at [facility] eating disorders unit.  Patient lives with 2 roommates in [place] and stress states that he is an only child and his parents are supportive.  Denies using drugs or alcohol.  Chief Complaint  Patient presents with  . Anxiety  . Depression  . Stress  . Agitation    HPI Review of Systems  HENT: Negative.   Eyes: Negative.    Respiratory: Negative.   Cardiovascular: Negative.   Gastrointestinal: Negative.   Endocrine: Negative.   Genitourinary: Negative.   Allergic/Immunologic: Negative.   Hematological: Negative.    Physical Exam  Depressive Symptoms: depressed mood, anhedonia, insomnia, psychomotor retardation, fatigue, feelings of worthlessness/guilt, difficulty concentrating, hopelessness, recurrent thoughts of death, anxiety, panic attacks, loss of energy/fatigue,  (Hypo) Manic Symptoms:   Elevated Mood:  No Irritable Mood:  No Grandiosity:  No Distractibility:  Yes Labiality of Mood:  No Delusions:  No Hallucinations:  No Impulsivity:  Yes Sexually Inappropriate Behavior:  No Financial Extravagance:  No Flight of Ideas:  No  Anxiety Symptoms: Excessive Worry:  Yes Panic Symptoms:  Yes Agoraphobia:  No Obsessive Compulsive: No  Symptoms: None, Specific Phobias:  No Social Anxiety:  Yes  Psychotic Symptoms:  Hallucinations: No None Delusions:  No Paranoia:  No   Ideas of Reference:  No  PTSD Symptoms: None   Traumatic Brain Injury: No   Past Psychiatric History: Diagnosis: Anorexia nervosa and anxiety   Hospitalizations: : [facility] in 2010 [place] eating disorders clinic at [facility]. 2010 patient was treated with Risperdal in the past   Outpatient Care: Sees a psychiatrist and a therapist at [facility].   Substance Abuse Care:   Self-Mutilation:   Suicidal Attempts:   Violent Behaviors:    Past Medical History:   Past Medical History  Diagnosis Date  . Anxiety   . Depression    History of Loss of Consciousness:  No Seizure History:  No Cardiac History:  No Allergies:  No Known Allergies Current Medications:  Current Outpatient Prescriptions  Medication Sig Dispense Refill  . fluticasone (FLONASE) 50 MCG/ACT nasal spray Place 1 spray into both nostrils daily as needed for allergies or rhinitis.      . hydrOXYzine (VISTARIL) 25 MG capsule Take 25 mg by mouth once. Take at hs       . Multiple Vitamin (MULTIVITAMIN WITH MINERALS) TABS tablet Take 1 tablet by mouth daily.      Marland Kitchen escitalopram (LEXAPRO) 20 MG tablet Take 1 tablet (20 mg total) by mouth daily.  30 tablet  0   No current facility-administered medications for this visit.    Previous Psychotropic Medications:  Medication Dose   Risperdal   unknown                      Substance Abuse History in the last 12 months: Not applicable patient has never used any chemicals Substance Age of 1st Use Last Use Amount Specific Type  Nicotine      Alcohol      Cannabis      Opiates      Cocaine      Methamphetamines      LSD      Ecstasy      Benzodiazepines      Caffeine      Inhalants      Others:                          Medical Consequences of Substance Abuse: Not applicable  Legal Consequences of Substance Abuse: Not applicable  Family Consequences of Substance Abuse: Not applicable  Blackouts:  No DT's:  No Withdrawal Symptoms:  No None  Social History: Current Place of Residence: [place] Birth:  Family Members:  Marital Status:  Single Children: 0  Sons:   Daughters:  Relationships:  Education:  Corporate treasurer Problems/Performance: Poor Religious Beliefs/Practices:  History of Abuse: none Teacher, music History:  None. Legal History: None Hobbies/Interests:   Family History:   Family History  Problem Relation Age of Onset  . Cancer Other   . Anxiety disorder Father   . Depression Father   . Anxiety disorder Paternal Grandfather   . Depression Paternal Grandfather     Mental Status Examination/Evaluation: Objective:  Appearance: Casual  Eye Contact::  Good  Speech:  Clear and Coherent and Normal Rate  Volume:  Normal  Mood:  Depression and anxiety   Affect:  Constricted and Depressed  Thought Process:  Goal Directed, Linear and Logical  Orientation:  Full (Time, Place, and Person)  Thought Content:  Rumination  Suicidal  Thoughts:  No  Homicidal Thoughts:  No  Judgement:  Good  Insight:  Good  Psychomotor Activity:  Normal  Akathisia:  No  Handed:  Right  AIMS (if indicated):  0  Assets:  Communication Skills Desire for Improvement Financial Resources/Insurance Physical Health Resilience Social Support Transportation    Laboratory/X-Ray Psychological Evaluation(s)        Assessment: 23 year old white single male admitted with a diagnosis of depression anxiety and symptoms of ADHD. Patient will start IOP and will be treated and stabilized.  AXIS I ADHD, inattentive type, Anxiety Disorder NOS and Major Depression, Recurrent severe  AXIS II Cluster C Traits  AXIS III Past Medical History  Diagnosis Date  . Anxiety   . Depression      AXIS IV educational problems, other psychosocial or environmental problems, problems  related to social environment and problems with primary support group  AXIS V 51-60 moderate symptoms   Treatment Plan/Recommendations:  Plan of Care: Start IOP   Laboratory:  None at this time  Psychotherapy: Group in usual and milieu therapy   Medications: Discussed R/R/B/O of Lexapro or his depression and pt gave me his informed consent. Start Lexapro 20 mg po q am. Also discussed treating his ADHD  Routine PRN Medications:  Yes  Consultations:   Safety Concerns:  none  Other:  ELOS 2 weeks    Margit Bandaadepalli, Lawanna Cecere, MD 4/1/201511:35 AM

## 2013-06-18 ENCOUNTER — Encounter (HOSPITAL_COMMUNITY): Payer: Self-pay | Admitting: Psychiatry

## 2013-06-18 ENCOUNTER — Other Ambulatory Visit (HOSPITAL_COMMUNITY): Payer: 59 | Admitting: Psychiatry

## 2013-06-18 DIAGNOSIS — F32A Depression, unspecified: Secondary | ICD-10-CM

## 2013-06-18 DIAGNOSIS — F329 Major depressive disorder, single episode, unspecified: Secondary | ICD-10-CM

## 2013-06-18 NOTE — Progress Notes (Signed)
    Daily Group Progress Note  Program: IOP  Group Time: 9:00-10:30  Participation Level: Active  Behavioral Response: Appropriate  Type of Therapy:  Group Therapy  Summary of Progress: Pt. Participated in meditation exercise. Pt. Shared with group how his hand injury was related to his anger. Pt. Participated in discussion about learning to accept the range of emotions.     Group Time: 10:30-12:00  Participation Level:  Active  Behavioral Response: Appropriate  Type of Therapy: Psycho-education Group  Summary of Progress: Participated in discussion about understanding the purpose of emotions and how they might teach us about our experiences.  Shaune PollackBrown, Jennifer B, COUNS

## 2013-06-18 NOTE — Progress Notes (Unsigned)
    Daily Group Progress Note  Program: IOP  Group Time: 9:00-10:30  Participation Level: Active  Behavioral Response: Appropriate  Type of Therapy:  Group Therapy  Summary of Progress: Met with case manager and psychiatrist.     Group Time: 10:30-12:00  Participation Level:  Active  Behavioral Response: Appropriate  Type of Therapy: Psycho-education Group  Summary of Progress: Met with case Freight forwarder and psychiatrist.  [facility]

## 2013-06-19 ENCOUNTER — Other Ambulatory Visit (HOSPITAL_COMMUNITY): Payer: 59

## 2013-06-22 ENCOUNTER — Encounter (HOSPITAL_COMMUNITY): Payer: Self-pay | Admitting: Psychology

## 2013-06-22 ENCOUNTER — Other Ambulatory Visit (HOSPITAL_COMMUNITY): Payer: 59 | Admitting: Psychology

## 2013-06-22 DIAGNOSIS — F329 Major depressive disorder, single episode, unspecified: Secondary | ICD-10-CM

## 2013-06-22 DIAGNOSIS — F411 Generalized anxiety disorder: Secondary | ICD-10-CM

## 2013-06-22 DIAGNOSIS — F32A Depression, unspecified: Secondary | ICD-10-CM

## 2013-06-22 NOTE — Progress Notes (Signed)
    Daily Group Progress Note  Program: IOP  Group Time: 9-10:30 am  Participation Level: Active  Behavioral Response: Appropriate and Sharing  Type of Therapy:  Process Group  Summary of Progress: The patient arrived late but was attentive and engaged in the session. He reported he had had a good weekend and explained he had gone [place] with his girlfriend and her family. He had had a great time. He explained he was a runner, but because of his injured right hand, he is temporarily not able to run. He agreed that this physical activity had been very helpful to his mood and when I encouraged him to hike around [place], he reported he was familiar with [place] and other good places to hike nearby. When asked about his wrapped hand, the patient reported he had gotten angry and hit a wall. The patient laughed when I asked how the wall made out? He made some good comments and responded well to this intervention.   Group Time: 10:45-11:30 am  Participation Level:  Active  Behavioral Response: Appropriate and Sharing  Type of Therapy: Psycho-education Group  Summary of Progress: When discussing the Serenity Prayer, the patient quickly grasped the importance of this exercise. He identified things he could change to include: his attitude. When asked what he couldn't change, the patient admitted he can't change what other people think of him. The patient shared that he has struggled with anorexia and pointed out that there are certain things or situations that he must avoid or otherwise they trigger thoughts about restricting his food intake. The patient displayed good insight about his recovery from an eating disorder and he made some excellent comments.   Treanna Dumler, LCAS

## 2013-06-23 ENCOUNTER — Other Ambulatory Visit (HOSPITAL_COMMUNITY): Payer: 59 | Admitting: Psychiatry

## 2013-06-23 ENCOUNTER — Encounter (HOSPITAL_COMMUNITY): Payer: Self-pay | Admitting: Psychiatry

## 2013-06-23 DIAGNOSIS — F32A Depression, unspecified: Secondary | ICD-10-CM

## 2013-06-23 DIAGNOSIS — F329 Major depressive disorder, single episode, unspecified: Secondary | ICD-10-CM

## 2013-06-23 NOTE — Progress Notes (Signed)
    Daily Group Progress Note  Program: IOP  Group Time: 9:00-10:30  Participation Level: Active  Behavioral Response: Appropriate  Type of Therapy:  Group Therapy  Summary of Progress: Pt. Participated in meditation. Pt. Shared social pressure of needing to have future and career plans determined and desiring connection with professors who do not prioritize have relationships with students. Pt. Also expressed feeling different from his peer group because of his anger and how he externally processes his feelings.     Group Time: 10:30-12:00  Participation Level:  Active  Behavioral Response: Appropriate  Type of Therapy: Psycho-education Group  Summary of Progress: Pt. Participated in discussion about types of cognitive distortions.  Shaune PollackBrown, Jennifer B, COUNS

## 2013-06-23 NOTE — Progress Notes (Signed)
Patient ID: Corey CourtBenjamin D Simpson, male   DOB: 08/26/1990, 23 y.o.   MRN: [id number] Pt seen states on lexapro 20 mg and tol it well notes no change. Pt feels a little calmer. Sleep, Appetite is good. No si/ Hi, No hall/ del

## 2013-06-24 ENCOUNTER — Encounter (HOSPITAL_COMMUNITY): Payer: Self-pay | Admitting: Psychiatry

## 2013-06-24 ENCOUNTER — Other Ambulatory Visit (HOSPITAL_COMMUNITY): Payer: 59 | Admitting: Psychiatry

## 2013-06-24 DIAGNOSIS — F329 Major depressive disorder, single episode, unspecified: Secondary | ICD-10-CM

## 2013-06-24 DIAGNOSIS — F32A Depression, unspecified: Secondary | ICD-10-CM

## 2013-06-24 NOTE — Progress Notes (Signed)
    Daily Group Progress Note  Program: IOP  Group Time: 9:00-10:30  Participation Level: Active  Behavioral Response: Appropriate  Type of Therapy:  Group Therapy  Summary of Progress: Pt. Participated in meditation and guided visualization exercise. Pt. Shared history of negative self-talk and comparing his accomplishments to the accomplishments of others.     Group Time: 10:30-12:00  Participation Level:  Active  Behavioral Response: Appropriate  Type of Therapy: Psycho-education Group  Summary of Progress: Pt. Participated in discussion about rewriting negative self-talk.  Shaune PollackBrown, Gaston Dase B, COUNS

## 2013-06-25 ENCOUNTER — Other Ambulatory Visit (HOSPITAL_COMMUNITY): Payer: 59 | Admitting: Psychiatry

## 2013-06-26 ENCOUNTER — Other Ambulatory Visit (HOSPITAL_COMMUNITY): Payer: 59 | Admitting: Psychiatry

## 2013-06-26 DIAGNOSIS — F329 Major depressive disorder, single episode, unspecified: Secondary | ICD-10-CM

## 2013-06-26 DIAGNOSIS — F32A Depression, unspecified: Secondary | ICD-10-CM

## 2013-06-26 NOTE — Progress Notes (Signed)
    Daily Group Progress Note  Program: IOP  Group Time:  9:00-10:45 a.m.  Participation Level: Active  Behavioral Response: Sharing  Type of Therapy:  Process Group  Summary of Progress: Pt voices that he is feeling better since starting MH-IOP.  Reports that he is applying anger management techniques.  Pt was able to provide feedback to a new member, re: frustrations with the long process of working with a college to have your classes dropped.     Group Time:  11:00-12noon  Participation Level:  Active  Behavioral Response: Sharing  Type of Therapy: Psycho-education Group  Summary of Progress: Discussed discharge planning, support groups and ways to structure his days.  [facility] Psych

## 2013-06-29 ENCOUNTER — Other Ambulatory Visit (HOSPITAL_COMMUNITY): Payer: 59 | Admitting: Psychiatry

## 2013-06-29 ENCOUNTER — Encounter (HOSPITAL_COMMUNITY): Payer: Self-pay | Admitting: Psychiatry

## 2013-06-29 DIAGNOSIS — F32A Depression, unspecified: Secondary | ICD-10-CM

## 2013-06-29 DIAGNOSIS — F329 Major depressive disorder, single episode, unspecified: Secondary | ICD-10-CM

## 2013-06-29 NOTE — Progress Notes (Signed)
    Daily Group Progress Note  Program: IOP  Group Time: 9:00-10:30  Participation Level: Active  Behavioral Response: Appropriate  Type of Therapy:  Group Therapy  Summary of Progress: Pt. Participated in morning meditation. Pt. Reported brighter mood attributed to getting encouraging news about his graduation date. Pt. Identified pattern of being a "giver" in his relationships which was often not reciprocated.      Group Time: 10:30-12:00  Participation Level:  Active  Behavioral Response: Appropriate  Type of Therapy: Psycho-education Group  Summary of Progress: Pt. Participated in discussion about developing "wise mind" and developing ability to make healthy decisions.  Shaune PollackBrown, Lorain Keast B, COUNS

## 2013-06-29 NOTE — Progress Notes (Signed)
    Daily Group Progress Note  Program: IOP  Group Time: 9:00-10:30  Participation Level: Active  Behavioral Response: Appropriate  Type of Therapy:  Group Therapy  Summary of Progress: Pt. Participated in morning meditation. Pt. Shared challenge of setting healthy boundaries with friends, feeling excessive feelings of obligation in friend relationships.     Group Time: 10:30-12:00  Participation Level:  Active  Behavioral Response: Appropriate  Type of Therapy: Psycho-education Group  Summary of Progress: Pt. Participated in grief and loss group facilitated by Theda BelfastBob Hamilton.  Shaune PollackBrown, Jennifer B, COUNS

## 2013-06-30 ENCOUNTER — Ambulatory Visit (HOSPITAL_COMMUNITY): Payer: Self-pay

## 2013-06-30 ENCOUNTER — Telehealth (HOSPITAL_COMMUNITY): Payer: Self-pay | Admitting: Psychiatry

## 2013-07-01 ENCOUNTER — Encounter (HOSPITAL_COMMUNITY): Payer: Self-pay | Admitting: Psychiatry

## 2013-07-01 ENCOUNTER — Other Ambulatory Visit (HOSPITAL_COMMUNITY): Payer: 59 | Admitting: Psychiatry

## 2013-07-01 DIAGNOSIS — F32A Depression, unspecified: Secondary | ICD-10-CM

## 2013-07-01 DIAGNOSIS — F329 Major depressive disorder, single episode, unspecified: Secondary | ICD-10-CM

## 2013-07-01 NOTE — Progress Notes (Signed)
    Daily Group Progress Note  Program: IOP  Group Time: 9:00-10:30  Participation Level: Active  Behavioral Response: Appropriate  Type of Therapy:  Group Therapy  Summary of Progress: Pt. Participated in morning meditation and reflection. Pt. Shared relationship with mother who has been a source of stress and pattern of people pleasing in friend relationships.     Group Time: 10:30-12:00  Participation Level:  Active  Behavioral Response: Appropriate  Type of Therapy: Psycho-education Group  Summary of Progress: Pt. Participated in discussion of recognizing the purpose of the stress response and developing healthy relationship boundaries.  Shaune PollackBrown, Jaquitta Dupriest B, COUNS

## 2013-07-02 ENCOUNTER — Other Ambulatory Visit (HOSPITAL_COMMUNITY): Payer: 59 | Admitting: Psychiatry

## 2013-07-02 ENCOUNTER — Encounter (HOSPITAL_COMMUNITY): Payer: Self-pay | Admitting: Psychiatry

## 2013-07-02 DIAGNOSIS — F32A Depression, unspecified: Secondary | ICD-10-CM

## 2013-07-02 DIAGNOSIS — F329 Major depressive disorder, single episode, unspecified: Secondary | ICD-10-CM

## 2013-07-02 NOTE — Progress Notes (Signed)
    Daily Group Progress Note  Program: IOP  Group Time: 9:00-10:30  Participation Level: Active  Behavioral Response: Appropriate  Type of Therapy:  Group Therapy  Summary of Progress: Pt. Participated in morning meditation. Pt. Shared experiences of feeling pressured to meet standards set by his peers and parents.     Group Time: 10:30-12:00  Participation Level:  Active  Behavioral Response: Appropriate  Type of Therapy: Psycho-education Group  Summary of Progress: Pt. Participated in discussion about community mental health support services and yoga grounding exercises.  Shaune PollackBrown, Gem Conkle B, COUNS

## 2013-07-03 ENCOUNTER — Encounter (HOSPITAL_COMMUNITY): Payer: Self-pay | Admitting: Psychiatry

## 2013-07-03 ENCOUNTER — Other Ambulatory Visit (HOSPITAL_COMMUNITY): Payer: 59 | Admitting: Psychiatry

## 2013-07-03 DIAGNOSIS — F32A Depression, unspecified: Secondary | ICD-10-CM

## 2013-07-03 DIAGNOSIS — F329 Major depressive disorder, single episode, unspecified: Secondary | ICD-10-CM

## 2013-07-03 NOTE — Progress Notes (Signed)
    Daily Group Progress Note  Program: IOP  Group Time: 9:00-10:30  Participation Level: Active  Behavioral Response: Appropriate  Type of Therapy:  Group Therapy  Summary of Progress: Pt. Participated in meditation. Pt. Shared that he has been troubled by not able to get up in the morning and sleeping through alarm, believes that this is a side effect of his medication. Pt. Shared feeling that he has been a burden to his parents and challenged by self-critical thoughts related to beliefs about not living up to his parents' expectations for him.     Group Time: 10:30-12:00  Participation Level:  Active  Behavioral Response: Appropriate  Type of Therapy: Psycho-education Group  Summary of Progress: Pt. Participated in group focused on developing self-compassion.  Shaune PollackBrown, Prynce Jacober B, COUNS

## 2013-07-06 ENCOUNTER — Encounter (HOSPITAL_COMMUNITY): Payer: Self-pay | Admitting: Psychiatry

## 2013-07-06 ENCOUNTER — Other Ambulatory Visit (HOSPITAL_COMMUNITY): Payer: 59 | Admitting: Psychiatry

## 2013-07-06 DIAGNOSIS — F32A Depression, unspecified: Secondary | ICD-10-CM

## 2013-07-06 DIAGNOSIS — F329 Major depressive disorder, single episode, unspecified: Secondary | ICD-10-CM

## 2013-07-06 MED ORDER — ESCITALOPRAM OXALATE 20 MG PO TABS: 20.0000 mg | ORAL_TABLET | Freq: Every day | ORAL | Status: AC
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
## 2013-07-06 NOTE — Progress Notes (Signed)
Discharge Note  Patient:  [name] is an 23 y.o., male DOB:  07/25/1990  Date of Admission: 4 /1/15  Date of Discharge:  07/06/13  Reason for Admission:Patient is a 23 year old white single male who was [facility]. Last week the patient became severely depressed and had thoughts of suicide so went [facility], and assessment was done and he was given Vistaril 25 mg at bedtime for his anxiety.  Patient is a Holiday representativejunior at [employer] and was placed on academic probation and has now restarted school. He is struggling with his glasses due to disorganization and poor concentration. Patient works part-time at [employer] and last week became very depressed with thoughts of suicide. Reports that his symptoms worsened a year ago around the time when he was placed on academic probation.  Patient has anger problems tends to get frustrated easily he is impulsive. He got angry last week and hit a wall and has a boxer's fracture on his right hand. Reports that his sleep is poor with initial insomnia, feels tired appetite is good is very irritable depressed tearful and anxious. Has about 3-4 panic attacks in a month. Feels hopeless and helpless denies homicidal ideation has no hallucinations or delusions.  Patient reports that as a child he was always hyperactive with poor concentration and uses the distracted. He is always been disorganized restless and fidgety. States that he was an obese child and in high school began losing weight and started running and was diagnosed with anorexia and received [facility].  Patient lives [place] 2 roommates in [place] and stress states that he is an only child and his parents are supportive. Denies using drugs or alcohol.   Hospital Course: Patient started IOP and was started on Lexapro 20 mg every day. He tolerated this medication well and gradually stabilized. He began attending group and was noted to  be a perfectionist with negative self talk. These cognitive distortions where dressed in group and he was able to process them out. He also talked about his conflict with his mother and learnt anger management techniques and behavioral and coping skills. Patient was doing well and had stabilized well with good sleep and appetite and calm mood and good concentration with no SI or HI.  Mental Status at Discharge: Alert, oriented x3, affect was full mood is euthymic speech and language are normal, musculoskeletal and is normal. No suicidal or homicidal ideation no hallucinations or delusions. Recent and remote memory is good, judgment and insight is good, concentration and recall are good. Lab Results: No results found for this or any previous visit (from the past 48 hour(s)).  Current outpatient prescriptions:escitalopram (LEXAPRO) 20 MG tablet, Take 1 tablet (20 mg total) by mouth daily., Disp: 30 tablet, Rfl: 0;  fluticasone (FLONASE) 50 MCG/ACT nasal spray, Place 1 spray into both nostrils daily as needed for allergies or rhinitis., Disp: , Rfl: ;  hydrOXYzine (VISTARIL) 25 MG capsule, Take 25 mg by mouth once. Take at hs, Disp: , Rfl:  Multiple Vitamin (MULTIVITAMIN WITH MINERALS) TABS tablet, Take 1 tablet by mouth daily., Disp: , Rfl:   Axis Diagnosis:   Axis I: Anxiety Disorder NOS and Major Depression, Recurrent severe Axis II: Deferred Axis III:  Past Medical History  Diagnosis Date  . Anxiety   . Depression    Axis IV: educational problems, other psychosocial or environmental problems, problems related to social environment and  problems with primary support group Axis V: 61-70 mild symptoms   Level of Care:  OP  Discharge destination:  Home  Is patient on multiple antipsychotic therapies at discharge:  No    Has Patient had three or more failed trials of antipsychotic monotherapy by history:  No  Patient phone:  [phone] (home)  Patient address:   [address] [place] [address],   Follow-up recommendations:  Activity:  As tolerated Diet:  Regular Other:  Followup for medications and therapy as scheduled  Comments:    The patient received suicide prevention pamphlet:  Yes   Gayland CurryGayathri D Alayna Mabe 07/06/2013, 12:26 PM

## 2013-07-06 NOTE — Progress Notes (Signed)
[name] is a 23 y.o. single, Caucasian male, who was referred [facility], treatment for ongoing depressive and anxiety symptoms with anger outbursts and SI (no plan or intent). Pt admitted to a recent attempt (06-12-13). States he tried to hang himself with a belt, but couldn't get it right. "I stopped trying after six hours." Pt admitted to punching himself in the head whenever he gets angry. Two previous psychiatric hospitalizations. States in August 2010, he [facility] few [facility] before being [facility]. He has [facility] a therapist and [facility]. Stated that his symptoms included poor sleep (5 Hrs), irritability, tearfulness, panic attacks (three to four times a month), and poor concentration. He had been having anger outbursts. On 06-11-13, pt put his right hand through a wall and broke his it. Reported that his symptoms worsened a year ago. Triggers/Stressors: 1) Fall 2014 pt was placed on academic probation due to failing grades. 2) Conflict with band members. 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  Family Hx: Paternal Grandfather and Father were hospitalized for depression and anxiety. Today is pt's last day in MH-IOP.  Reports feeling significantly better (i.e. Improved sleep and concentration, decreased irritability and indecisiveness).  Appetite fluctuates.  Denies SI/HI or A/V hallucinations.  Pt states he would like to continue working on self esteem issues (self compassion).  A:  D/C today.  F[facility].  Case manager Brayton Caves(Jessie) will call patient with appointments.  Encouraged support groups.  R:  Pt receptive.

## 2013-07-06 NOTE — Patient Instructions (Signed)
Patient completed MH-IOP today.  [facility] with [facility] (therapist and psychiatrist).  Encouraged support groups.

## 2013-07-07 ENCOUNTER — Ambulatory Visit (HOSPITAL_COMMUNITY): Payer: Self-pay | Admitting: Psychiatry

## 2013-07-08 ENCOUNTER — Other Ambulatory Visit (HOSPITAL_COMMUNITY): Payer: 59

## 2013-07-09 ENCOUNTER — Other Ambulatory Visit (HOSPITAL_COMMUNITY): Payer: 59

## 2013-07-10 ENCOUNTER — Other Ambulatory Visit (HOSPITAL_COMMUNITY): Payer: 59

## 2013-07-13 ENCOUNTER — Other Ambulatory Visit (HOSPITAL_COMMUNITY): Payer: 59

## 2014-01-17 IMAGING — US US SCROTUM
1 series · 14 of 25 positions shown · non-contrast
Comparison: None.

CLINICAL DATA: Left testicular fullness

ULTRASOUND OF SCROTUM
TECHNIQUE: Complete ultrasound examination of the testicles,
epididymis, and other scrotal structures was performed.

[Series 1: us scrotum · 0.07mm/px · 14 of 65 slices shown]
[im 1/65]
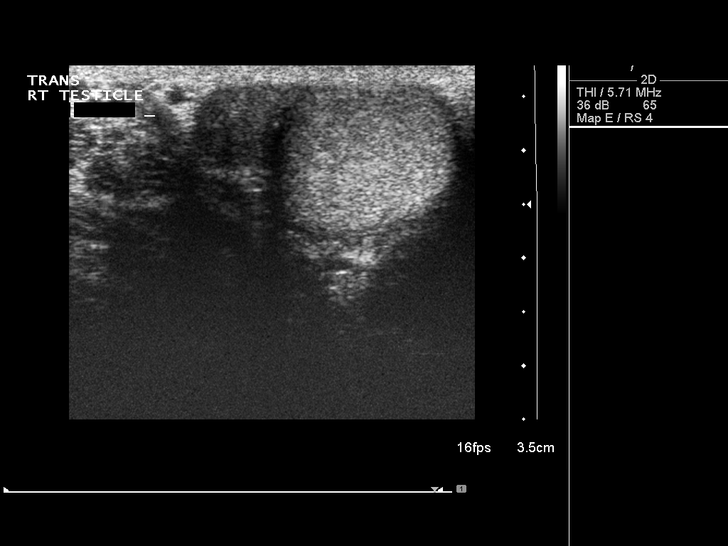
[im 6/65]
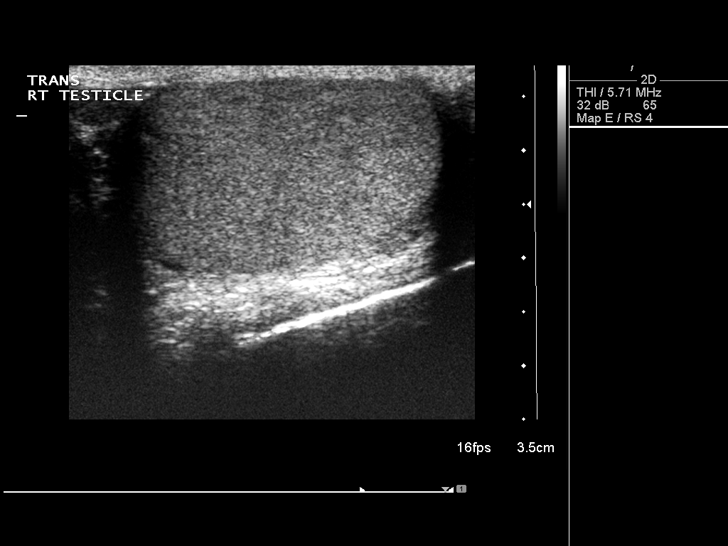
[im 11/65]
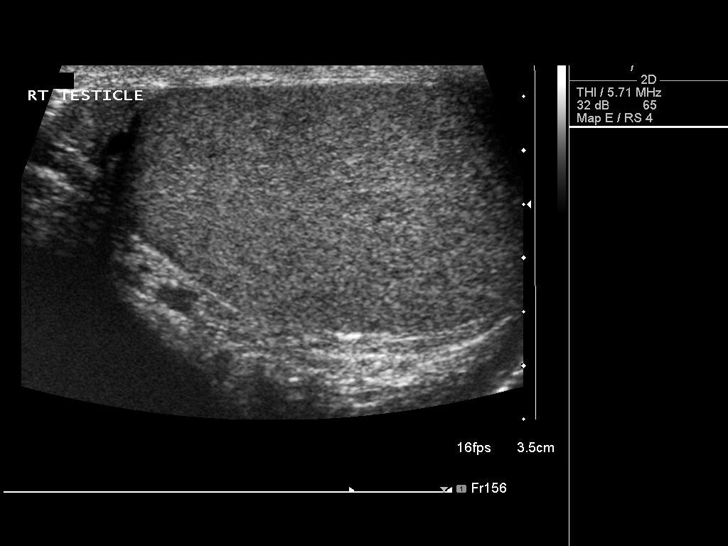
[im 17/65]
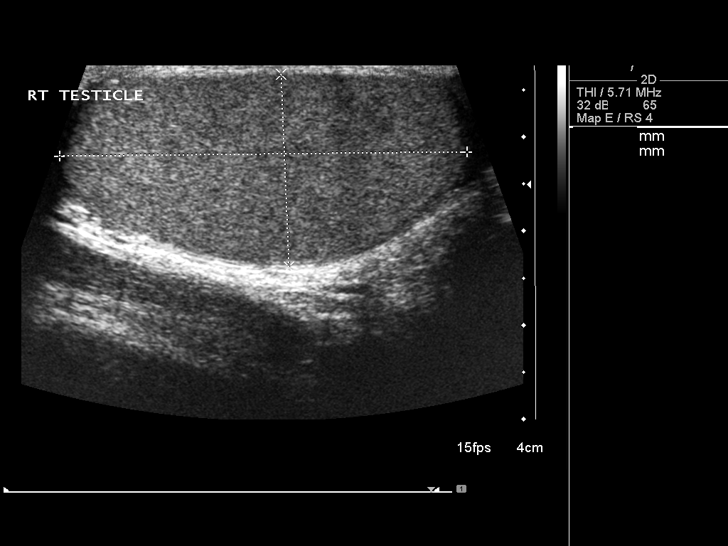
[im 22/65]
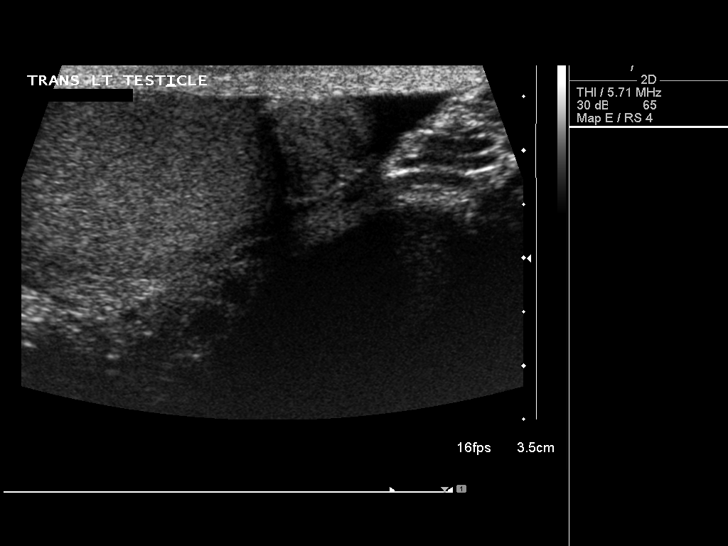
[im 25/65]
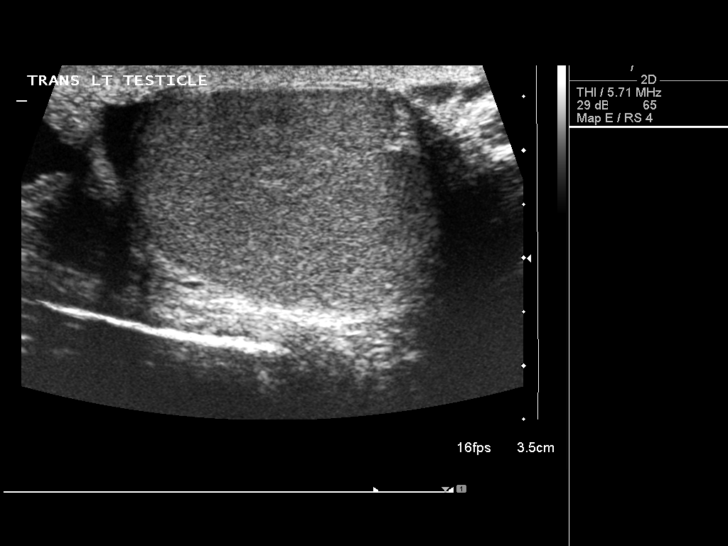
[im 30/65]
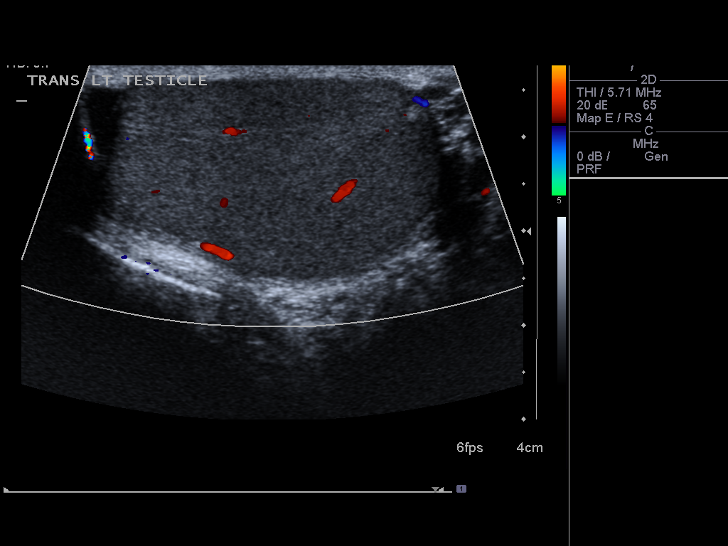
[im 35/65]
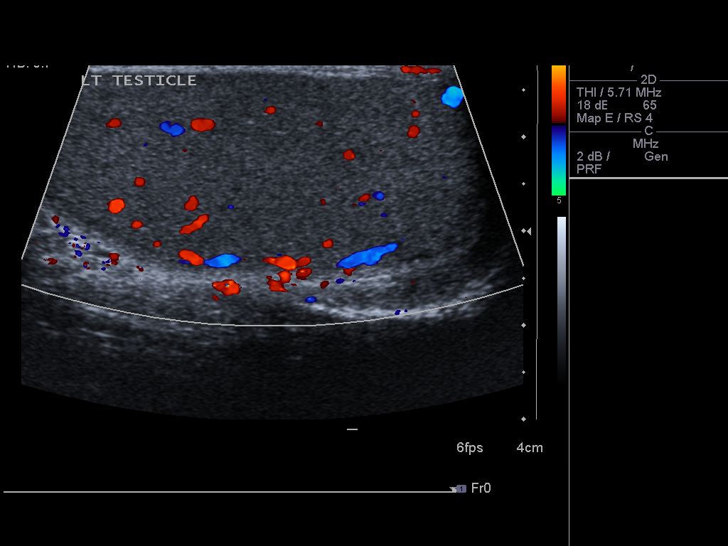
[im 41/65]
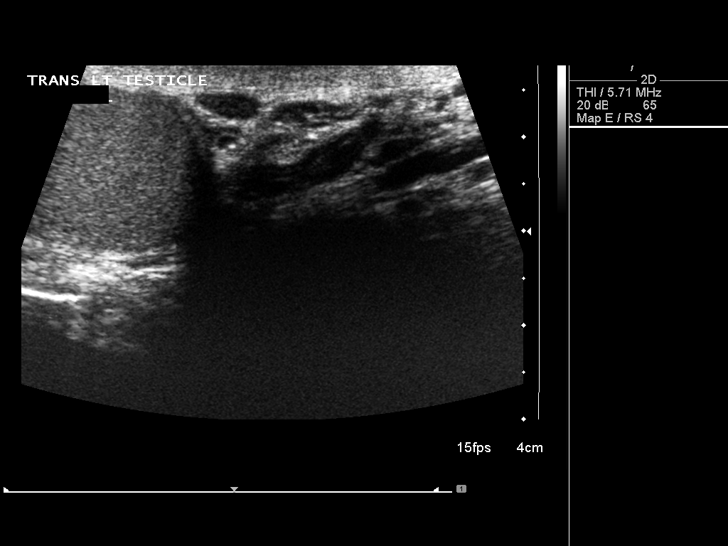
[im 43/65]
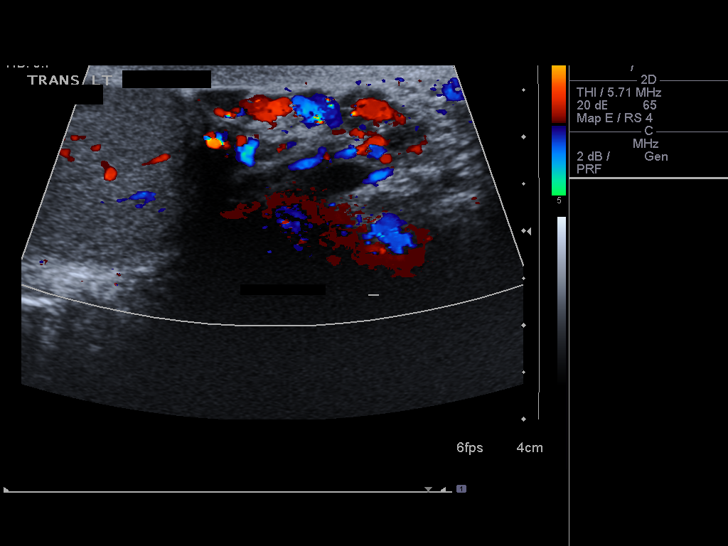
[im 49/65]
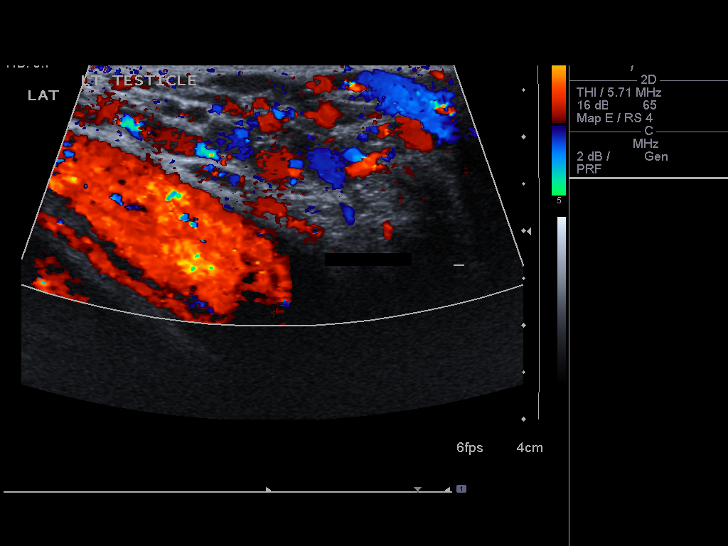
[im 54/65]
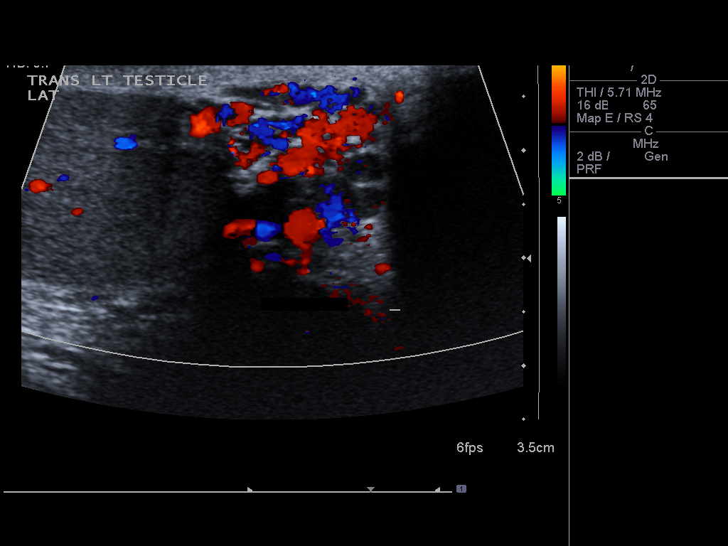
[im 59/65]
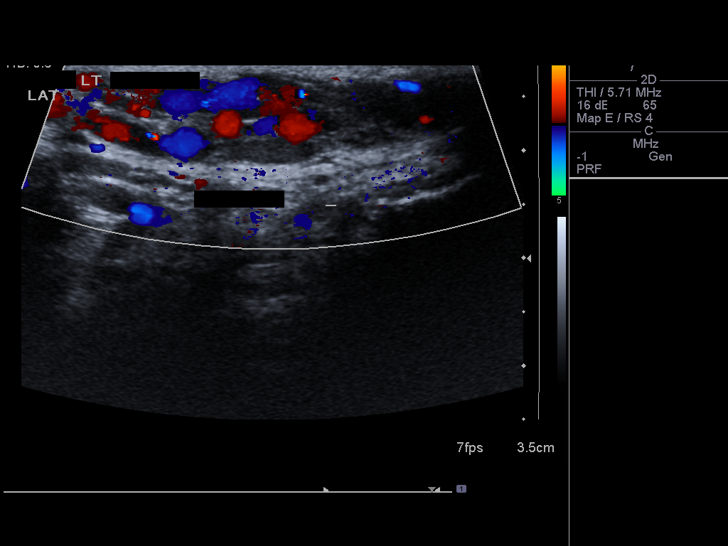
[im 65/65]
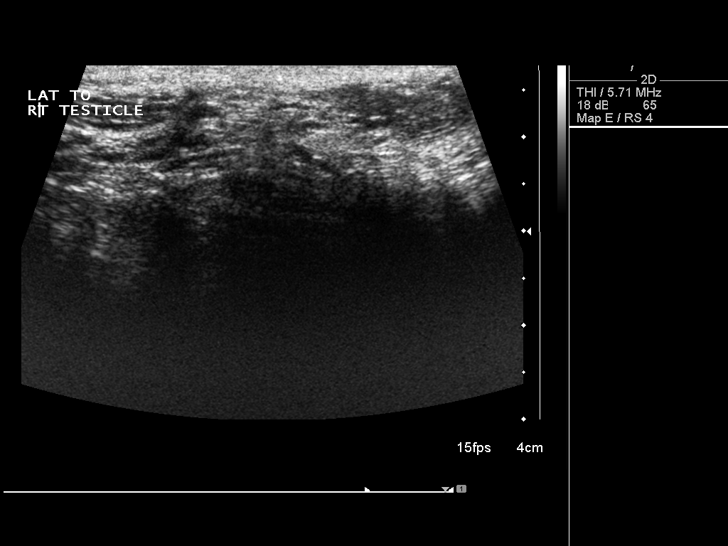

[14 of 25 positions shown; findings below may reference images not displayed]

FINDINGS: Right testis:  The right testicle is normal in size and
echogenicity.  No intratesticular abnormality is seen.  Blood flow
is demonstrated to the right testicle.

Left testis:  The left testicle also is normal in size and in
echogenicity.  Blood flow is demonstrated to the left testicle as
well.

Right epididymis:  The right epididymis is unremarkable.

Left epididymis:  The left epididymis also is normal.

Hydrocele:  No hydrocele is seen.

Varicocele:  There is a left lateral varicocele present.
IMPRESSION: 1.  No intratesticular abnormality.  Blood flow is demonstrated to
both testicles.
2.  Left lateral varicocele.

## 2015-05-03 DIAGNOSIS — H6691 Otitis media, unspecified, right ear: Secondary | ICD-10-CM | POA: Diagnosis not present

## 2015-05-03 MED FILL — AMOX-CLAV 875-125 MG TABLET: 875-125 | 10 days supply | Qty: 20 | Fill #0

## 2015-05-16 DIAGNOSIS — H6691 Otitis media, unspecified, right ear: Secondary | ICD-10-CM | POA: Diagnosis not present

## 2015-08-24 ENCOUNTER — Ambulatory Visit (HOSPITAL_COMMUNITY)
Admission: EM | Admit: 2015-08-24 | Discharge: 2015-08-24 | Disposition: A | Discharge status: Home / Self Care | Payer: 59 | Source: Intra-hospital | Attending: Psychiatry | Admitting: Psychiatry

## 2015-08-24 DIAGNOSIS — F411 Generalized anxiety disorder: Secondary | ICD-10-CM | POA: Diagnosis not present

## 2015-08-24 DIAGNOSIS — F1019 Alcohol abuse with unspecified alcohol-induced disorder: Secondary | ICD-10-CM | POA: Diagnosis not present

## 2015-08-24 DIAGNOSIS — F329 Major depressive disorder, single episode, unspecified: Secondary | ICD-10-CM | POA: Insufficient documentation
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
## 2015-09-14 DIAGNOSIS — F411 Generalized anxiety disorder: Secondary | ICD-10-CM | POA: Diagnosis not present

## 2015-09-19 DIAGNOSIS — F411 Generalized anxiety disorder: Secondary | ICD-10-CM | POA: Diagnosis not present

## 2015-10-05 DIAGNOSIS — F411 Generalized anxiety disorder: Secondary | ICD-10-CM | POA: Diagnosis not present

## 2016-01-02 DIAGNOSIS — Z23 Encounter for immunization: Secondary | ICD-10-CM | POA: Diagnosis not present

## 2016-01-02 DIAGNOSIS — Z202 Contact with and (suspected) exposure to infections with a predominantly sexual mode of transmission: Secondary | ICD-10-CM | POA: Diagnosis not present

## 2016-08-27 DIAGNOSIS — H5213 Myopia, bilateral: Secondary | ICD-10-CM | POA: Diagnosis not present

## 2016-12-15 LAB — GLUCOSE, POCT (MANUAL RESULT ENTRY): POC Glucose: 96 mg/dl (ref 70–99)

## 2019-01-10 DIAGNOSIS — Z20828 Contact with and (suspected) exposure to other viral communicable diseases: Secondary | ICD-10-CM | POA: Diagnosis not present

## 2019-03-28 DIAGNOSIS — K0889 Other specified disorders of teeth and supporting structures: Secondary | ICD-10-CM | POA: Diagnosis not present

## 2019-05-22 ENCOUNTER — Ambulatory Visit: Payer: Self-pay | Attending: Internal Medicine

## 2019-05-22 DIAGNOSIS — Z23 Encounter for immunization: Secondary | ICD-10-CM

## 2019-05-22 NOTE — Progress Notes (Signed)
   Covid-19 Vaccination Clinic  Name:  Tayshawn Purnell    MRN: [id number] DOB: March 10, 1991  05/22/2019  Mr. Michie was observed post Covid-19 immunization for 15 minutes without incident. He was provided with Vaccine Information Sheet and instruction to access the V-Safe system.   Mr. Whitecotton was instructed to call 911 with any severe reactions post vaccine: Marland Kitchen Difficulty breathing  . Swelling of face and throat  . A fast heartbeat  . A bad rash all over body  . Dizziness and weakness

## 2019-06-23 ENCOUNTER — Ambulatory Visit: Payer: Self-pay | Attending: Internal Medicine

## 2019-06-23 DIAGNOSIS — Z23 Encounter for immunization: Secondary | ICD-10-CM

## 2019-06-23 NOTE — Progress Notes (Signed)
   Covid-19 Vaccination Clinic  Name:  [name]    MRN: [id number] DOB: 03/21/1990  06/23/2019  [name] was observed post Covid-19 immunization for 15 minutes without incident. He was provided with Vaccine Information Sheet and instruction to access the V-Safe system.   [name] was instructed to call 911 with any severe reactions post vaccine: Marland Kitchen Difficulty breathing  . Swelling of face and throat  . A fast heartbeat  . A bad rash all over body  . Dizziness and weakness   Immunizations Administered    Name Date Dose VIS Date Route   Pfizer COVID-19 Vaccine 06/23/2019  3:21 PM 0.3 mL 02/27/2019 Intramuscular   Manufacturer: [employer], [employer]   Lot: [id number]   NDC: [address]-5

## 2021-07-03 DIAGNOSIS — F411 Generalized anxiety disorder: Secondary | ICD-10-CM | POA: Diagnosis not present

## 2021-07-12 DIAGNOSIS — F411 Generalized anxiety disorder: Secondary | ICD-10-CM | POA: Diagnosis not present

## 2021-07-19 DIAGNOSIS — F411 Generalized anxiety disorder: Secondary | ICD-10-CM | POA: Diagnosis not present

## 2021-08-02 DIAGNOSIS — F411 Generalized anxiety disorder: Secondary | ICD-10-CM | POA: Diagnosis not present

## 2021-08-14 DIAGNOSIS — F411 Generalized anxiety disorder: Secondary | ICD-10-CM | POA: Diagnosis not present

## 2021-08-31 DIAGNOSIS — F411 Generalized anxiety disorder: Secondary | ICD-10-CM | POA: Diagnosis not present

## 2021-09-15 DIAGNOSIS — F411 Generalized anxiety disorder: Secondary | ICD-10-CM | POA: Diagnosis not present

## 2021-10-05 DIAGNOSIS — F411 Generalized anxiety disorder: Secondary | ICD-10-CM | POA: Diagnosis not present

## 2021-10-28 DIAGNOSIS — F411 Generalized anxiety disorder: Secondary | ICD-10-CM | POA: Diagnosis not present

## 2021-12-07 DIAGNOSIS — F411 Generalized anxiety disorder: Secondary | ICD-10-CM | POA: Diagnosis not present

## 2021-12-24 DIAGNOSIS — F411 Generalized anxiety disorder: Secondary | ICD-10-CM | POA: Diagnosis not present

## 2022-01-07 DIAGNOSIS — F411 Generalized anxiety disorder: Secondary | ICD-10-CM | POA: Diagnosis not present

## 2022-01-21 DIAGNOSIS — F411 Generalized anxiety disorder: Secondary | ICD-10-CM | POA: Diagnosis not present

## 2022-03-14 DIAGNOSIS — F411 Generalized anxiety disorder: Secondary | ICD-10-CM | POA: Diagnosis not present

## 2024-03-11 ENCOUNTER — Ambulatory Visit (HOSPITAL_COMMUNITY): Admission: EM | Admit: 2024-03-11 | Discharge: 2024-03-11 | Disposition: A | Discharge status: Home / Self Care

## 2024-03-11 DIAGNOSIS — F332 Major depressive disorder, recurrent severe without psychotic features: Secondary | ICD-10-CM | POA: Diagnosis not present
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
## 2024-03-11 NOTE — Discharge Instructions (Signed)
 Patient is instructed prior to discharge to:  Take all medications as prescribed by his/her mental healthcare provider. Report any adverse effects and or reactions from the medicines to his/her outpatient provider promptly. Keep all scheduled appointments, to ensure that you are getting refills on time and to avoid any interruption in your medication.  If you are unable to keep an appointment call to reschedule.  Be sure to follow-up with resources and follow-up appointments provided.  Patient has been instructed & cautioned: To not engage in alcohol and or illegal drug use while on prescription medicines. In the event of worsening symptoms, patient is instructed to call the crisis hotline, 911 and or go to the nearest ED for appropriate evaluation and treatment of symptoms. To follow-up with his/her primary care provider for your other medical issues, concerns and or health care needs.  Information: -National Suicide Prevention Lifeline 1-800-SUICIDE or (551) 045-8823.  -988 offers 24/7 access to trained crisis counselors who can help people experiencing mental health-related distress. People can call or text 988 or chat 988lifeline.org for themselves or if they are worried about a loved one who may need crisis support.      Patient and family are educated and verbalize understanding of mental health resources and other crisis services in the community. They are instructed to call 911 and present to the nearest emergency room should patient experience any suicidal/homicidal ideation, auditory/visual/hallucinations, or detrimental worsening of mental health condition.

## 2024-03-13 ENCOUNTER — Telehealth (HOSPITAL_COMMUNITY): Payer: Self-pay | Admitting: Licensed Clinical Social Worker

## 2024-03-13 NOTE — Telephone Encounter (Signed)
 See call log

## 2024-03-20 ENCOUNTER — Telehealth (HOSPITAL_COMMUNITY): Payer: Self-pay | Admitting: Licensed Clinical Social Worker

## 2024-03-20 NOTE — Telephone Encounter (Signed)
 See call intake

## 2024-03-24 ENCOUNTER — Telehealth (HOSPITAL_COMMUNITY): Payer: Self-pay | Admitting: Licensed Clinical Social Worker

## 2024-03-24 NOTE — Telephone Encounter (Signed)
"  See call log  "
